# Patient Record
Sex: Male | Born: 1955 | Race: White | Hispanic: No | Marital: Married | Smoking: Former smoker
Health system: Southern US, Community
[De-identification: ages and names within clinical notes are randomized; demographics above are authoritative.]

## PROBLEM LIST (undated history)

## (undated) DIAGNOSIS — J449 Chronic obstructive pulmonary disease, unspecified: Secondary | ICD-10-CM

## (undated) DIAGNOSIS — I10 Essential (primary) hypertension: Secondary | ICD-10-CM

## (undated) DIAGNOSIS — J302 Other seasonal allergic rhinitis: Secondary | ICD-10-CM

## (undated) DIAGNOSIS — M62838 Other muscle spasm: Secondary | ICD-10-CM

## (undated) DIAGNOSIS — K219 Gastro-esophageal reflux disease without esophagitis: Secondary | ICD-10-CM

## (undated) DIAGNOSIS — Z77090 Contact with and (suspected) exposure to asbestos: Secondary | ICD-10-CM

## (undated) DIAGNOSIS — G4730 Sleep apnea, unspecified: Secondary | ICD-10-CM

## (undated) DIAGNOSIS — M1990 Unspecified osteoarthritis, unspecified site: Secondary | ICD-10-CM

## (undated) DIAGNOSIS — E119 Type 2 diabetes mellitus without complications: Secondary | ICD-10-CM

## (undated) DIAGNOSIS — J45909 Unspecified asthma, uncomplicated: Secondary | ICD-10-CM

## (undated) HISTORY — PX: COLONOSCOPY: SHX174

## (undated) HISTORY — PX: CERVICAL FUSION: SHX112

## (undated) HISTORY — PX: NASAL SINUS SURGERY: SHX719

## (undated) HISTORY — PX: HAND SURGERY: SHX662

## (undated) HISTORY — PX: CARPAL TUNNEL RELEASE: SHX101

---

## 2004-09-28 ENCOUNTER — Ambulatory Visit: Payer: Self-pay | Admitting: Otolaryngology

## 2006-08-17 ENCOUNTER — Other Ambulatory Visit: Payer: Self-pay

## 2006-08-24 ENCOUNTER — Ambulatory Visit: Payer: Self-pay | Admitting: Unknown Physician Specialty

## 2008-03-16 ENCOUNTER — Emergency Department: Payer: Self-pay | Admitting: Internal Medicine

## 2008-12-15 ENCOUNTER — Ambulatory Visit: Payer: Self-pay | Admitting: Unknown Physician Specialty

## 2011-01-20 ENCOUNTER — Ambulatory Visit: Payer: Self-pay | Admitting: Unknown Physician Specialty

## 2011-04-13 ENCOUNTER — Ambulatory Visit: Payer: Self-pay | Admitting: Internal Medicine

## 2011-07-07 ENCOUNTER — Encounter (HOSPITAL_COMMUNITY)
Admission: RE | Admit: 2011-07-07 | Discharge: 2011-07-07 | Disposition: A | Discharge status: Home / Self Care | Payer: BC Managed Care – PPO | Source: Ambulatory Visit | Attending: Neurosurgery | Admitting: Neurosurgery

## 2011-07-07 ENCOUNTER — Encounter (HOSPITAL_COMMUNITY): Payer: Self-pay | Admitting: Pharmacy Technician

## 2011-07-07 LAB — BASIC METABOLIC PANEL
BUN: 18 mg/dL (ref 6–23)
CO2: 27 mEq/L (ref 19–32)
Calcium: 9.8 mg/dL (ref 8.4–10.5)
GFR calc non Af Amer: 79 mL/min — ABNORMAL LOW (ref >90)
Glucose, Bld: 96 mg/dL (ref 70–99)
Potassium: 4.1 mEq/L (ref 3.5–5.1)
Sodium: 141 mEq/L (ref 135–145)

## 2011-07-07 LAB — SURGICAL PCR SCREEN: Staphylococcus aureus: POSITIVE — AB

## 2011-07-07 LAB — CBC
HCT: 46.9 % (ref 39.0–52.0)
Hemoglobin: 16.1 g/dL (ref 13.0–17.0)
MCH: 30.4 pg (ref 26.0–34.0)
MCHC: 34.3 g/dL (ref 30.0–36.0)
RBC: 5.3 MIL/uL (ref 4.22–5.81)

## 2011-07-08 ENCOUNTER — Encounter (HOSPITAL_COMMUNITY)
Admission: RE | Disposition: A | Discharge status: Home / Self Care | Payer: Self-pay | Source: Ambulatory Visit | Attending: Neurosurgery

## 2011-07-08 ENCOUNTER — Encounter (HOSPITAL_COMMUNITY): Payer: Self-pay

## 2011-07-08 ENCOUNTER — Inpatient Hospital Stay (HOSPITAL_COMMUNITY)
Admission: RE | Admit: 2011-07-08 | Discharge: 2011-07-09 | DRG: 865 | Disposition: A | Discharge status: Home / Self Care | Payer: BC Managed Care – PPO | Source: Ambulatory Visit | Attending: Neurosurgery | Admitting: Neurosurgery

## 2011-07-08 ENCOUNTER — Other Ambulatory Visit: Payer: Self-pay

## 2011-07-08 ENCOUNTER — Ambulatory Visit (HOSPITAL_COMMUNITY): Payer: BC Managed Care – PPO

## 2011-07-08 ENCOUNTER — Other Ambulatory Visit (HOSPITAL_COMMUNITY): Payer: BC Managed Care – PPO

## 2011-07-08 DIAGNOSIS — J45909 Unspecified asthma, uncomplicated: Secondary | ICD-10-CM | POA: Diagnosis present

## 2011-07-08 DIAGNOSIS — M47812 Spondylosis without myelopathy or radiculopathy, cervical region: Principal | ICD-10-CM | POA: Diagnosis present

## 2011-07-08 DIAGNOSIS — Z01812 Encounter for preprocedural laboratory examination: Secondary | ICD-10-CM

## 2011-07-08 DIAGNOSIS — I10 Essential (primary) hypertension: Secondary | ICD-10-CM | POA: Diagnosis present

## 2011-07-08 SURGERY — ANTERIOR CERVICAL DECOMPRESSION/DISCECTOMY FUSION 3 LEVELS
Anesthesia: General

## 2011-07-08 MED ORDER — ONDANSETRON HCL 4 MG/2ML IJ SOLN: 4.0000 mg | Freq: Four times a day (QID) | INTRAMUSCULAR | Status: DC | PRN

## 2011-07-08 MED ORDER — MORPHINE SULFATE (PF) 1 MG/ML IV SOLN: INTRAVENOUS | Status: DC

## 2011-07-08 MED ORDER — OXYCODONE HCL 5 MG PO TABS: 2.5000 mg | ORAL_TABLET | ORAL | Status: DC | PRN

## 2011-07-08 MED ORDER — DIAZEPAM 5 MG PO TABS: 5.0000 mg | ORAL_TABLET | Freq: Three times a day (TID) | ORAL | Status: DC | PRN

## 2011-07-08 MED ORDER — FLUTICASONE PROPIONATE 50 MCG/ACT NA SUSP
2.0000 | Freq: Every day | NASAL | Status: DC
  Filled 2011-07-08: qty 16

## 2011-07-08 MED ORDER — OXYCODONE-ACETAMINOPHEN 5-325 MG PO TABS: 1.0000 | ORAL_TABLET | ORAL | Status: DC | PRN

## 2011-07-08 MED ORDER — DIPHENHYDRAMINE HCL 50 MG/ML IJ SOLN: 12.5000 mg | Freq: Four times a day (QID) | INTRAMUSCULAR | Status: DC | PRN

## 2011-07-08 MED ORDER — DEXAMETHASONE SODIUM PHOSPHATE 4 MG/ML IJ SOLN
4.0000 mg | Freq: Four times a day (QID) | INTRAMUSCULAR | Status: DC
  Filled 2011-07-08 (×7): qty 1

## 2011-07-08 MED ORDER — MONTELUKAST SODIUM 10 MG PO TABS
10.0000 mg | ORAL_TABLET | Freq: Every day | ORAL | Status: DC
  Filled 2011-07-08 (×3): qty 1

## 2011-07-08 MED ORDER — SODIUM CHLORIDE 0.9 % IJ SOLN: 9.0000 mL | INTRAMUSCULAR | Status: DC | PRN

## 2011-07-08 MED ORDER — DIPHENHYDRAMINE HCL 12.5 MG/5ML PO ELIX: 12.5000 mg | ORAL_SOLUTION | Freq: Four times a day (QID) | ORAL | Status: DC | PRN

## 2011-07-08 MED ORDER — PROCHLORPERAZINE EDISYLATE 5 MG/ML IJ SOLN: 5.0000 mg | Freq: Four times a day (QID) | INTRAMUSCULAR | Status: DC | PRN

## 2011-07-08 MED ORDER — FLUTICASONE PROPIONATE HFA 44 MCG/ACT IN AERO
2.0000 | INHALATION_SPRAY | Freq: Two times a day (BID) | RESPIRATORY_TRACT | Status: DC | PRN
  Filled 2011-07-08: qty 10.6

## 2011-07-08 MED ORDER — ONDANSETRON HCL 4 MG/2ML IJ SOLN: 4.0000 mg | INTRAMUSCULAR | Status: DC | PRN

## 2011-07-08 MED ORDER — AMLODIPINE BESYLATE 5 MG PO TABS
5.0000 mg | ORAL_TABLET | Freq: Every day | ORAL | Status: DC
  Filled 2011-07-08 (×2): qty 1

## 2011-07-08 MED ORDER — ALBUTEROL SULFATE HFA 108 (90 BASE) MCG/ACT IN AERS: 2.0000 | INHALATION_SPRAY | RESPIRATORY_TRACT | Status: DC | PRN

## 2011-07-08 MED ORDER — NALOXONE HCL 0.4 MG/ML IJ SOLN: 0.4000 mg | INTRAMUSCULAR | Status: DC | PRN

## 2012-05-10 ENCOUNTER — Encounter: Payer: Self-pay | Admitting: Neurosurgery

## 2012-06-05 ENCOUNTER — Encounter: Payer: Self-pay | Admitting: Neurosurgery

## 2012-08-09 ENCOUNTER — Ambulatory Visit: Payer: Self-pay | Admitting: Unknown Physician Specialty

## 2012-08-13 LAB — PATHOLOGY REPORT

## 2013-06-10 ENCOUNTER — Other Ambulatory Visit: Payer: Self-pay | Admitting: Neurosurgery

## 2013-06-10 DIAGNOSIS — M545 Low back pain: Secondary | ICD-10-CM

## 2013-06-21 ENCOUNTER — Ambulatory Visit
Admission: RE | Admit: 2013-06-21 | Discharge: 2013-06-21 | Disposition: A | Discharge status: Home / Self Care | Payer: BC Managed Care – PPO | Source: Ambulatory Visit | Attending: Neurosurgery | Admitting: Neurosurgery

## 2013-06-21 DIAGNOSIS — M545 Low back pain: Secondary | ICD-10-CM

## 2013-07-30 NOTE — Progress Notes (Signed)
Surgery scheduled for 08/21/13.  preop on 12/15 at 0800am.  Need orders in EPIC.  Thank You.

## 2013-08-06 ENCOUNTER — Other Ambulatory Visit: Payer: Self-pay | Admitting: Orthopedic Surgery

## 2013-08-06 NOTE — Progress Notes (Signed)
Preoperative surgical orders have been place into the Epic hospital system for Raymond [name] on 08/06/2013, 9:43 AM  by Patrica Duel for surgery on 08-21-2013.  Preop Knee Scope orders including IV Tylenol and IV Decadron as long as there are no contraindications to the above medications. Avel Peace, PA-C

## 2013-08-16 ENCOUNTER — Encounter (HOSPITAL_COMMUNITY): Payer: Self-pay | Admitting: Pharmacy Technician

## 2013-08-16 ENCOUNTER — Other Ambulatory Visit (HOSPITAL_COMMUNITY): Payer: Self-pay | Admitting: Orthopedic Surgery

## 2013-08-16 NOTE — Patient Instructions (Addendum)
20 Raymond [name]  08/16/2013   Your procedure is scheduled on:  08/21/13  [facility]  Report to [facility] at    100 PM     Call this number if you have problems the morning of surgery: [phone]       Remember:   Do not eat food  :After Midnight. Tuesday NIGHT-- MAY HAVE CLEAR LIQUIDS Wednesday MORNING UNTIL 1000am- THEN NOTHING BY MOUTH   Take these medicines the morning of surgery with A SIP OF WATER: AMLODIPINE May take Valium, Norco, or Oxy IR if needed  .  Contacts, dentures or partial plates can not be worn to surgery  Leave suitcase in the car. After surgery it may be brought to your room.  For patients admitted to the hospital, checkout time is 11:00 AM day of  discharge.             SPECIAL INSTRUCTIONS- SEE [facility] PREPARING FOR SURGERY INSTRUCTION SHEET-     DO NOT WEAR JEWELRY, LOTIONS, POWDERS, OR PERFUMES.  WOMEN-- DO NOT SHAVE LEGS OR UNDERARMS FOR 12 HOURS BEFORE SHOWERS. MEN MAY SHAVE FACE.  Patients discharged the day of surgery will not be allowed to drive home. IF going home the day of surgery, you must have a driver and someone to stay with you for the first 24 hours  Name and phone number of your driver:    wife                                                                    Please read over the following fact sheets that you were given: Incentive Spirometry Sheet                                                                                 [name]  PST [phone]  [phone]                 FAILURE TO FOLLOW THESE INSTRUCTIONS MAY RESULT IN  CANCELLATION   OF YOUR SURGERY                                                  Patient Signature _____________________________

## 2013-08-16 NOTE — Progress Notes (Signed)
OV Dr Dan Humphreys, chest, ekg, 11/14, stress test 7/14 on CHART

## 2013-08-19 ENCOUNTER — Encounter (HOSPITAL_COMMUNITY): Payer: Self-pay

## 2013-08-19 ENCOUNTER — Encounter (HOSPITAL_COMMUNITY)
Admission: RE | Admit: 2013-08-19 | Discharge: 2013-08-19 | Disposition: A | Discharge status: Home / Self Care | Payer: BC Managed Care – PPO | Source: Ambulatory Visit | Attending: Orthopedic Surgery | Admitting: Orthopedic Surgery

## 2013-08-19 HISTORY — DX: Other muscle spasm: M62.838

## 2013-08-19 HISTORY — DX: Essential (primary) hypertension: I10

## 2013-08-19 HISTORY — DX: Contact with and (suspected) exposure to asbestos: Z77.090

## 2013-08-19 HISTORY — DX: Other seasonal allergic rhinitis: J30.2

## 2013-08-19 HISTORY — DX: Unspecified asthma, uncomplicated: J45.909

## 2013-08-19 HISTORY — DX: Unspecified osteoarthritis, unspecified site: M19.90

## 2013-08-19 LAB — CBC
HCT: 44.7 % (ref 39.0–52.0)
Hemoglobin: 15.2 g/dL (ref 13.0–17.0)
MCH: 29.4 pg (ref 26.0–34.0)
MCV: 86.5 fL (ref 78.0–100.0)
Platelets: 272 10*3/uL (ref 150–400)
RBC: 5.17 MIL/uL (ref 4.22–5.81)

## 2013-08-19 LAB — BASIC METABOLIC PANEL
CO2: 25 mEq/L (ref 19–32)
Calcium: 9.5 mg/dL (ref 8.4–10.5)
Chloride: 100 mEq/L (ref 96–112)
GFR calc Af Amer: >90 mL/min (ref >90)
Glucose, Bld: 122 mg/dL — ABNORMAL HIGH (ref 70–99)
Potassium: 4.1 mEq/L (ref 3.5–5.1)
Sodium: 137 mEq/L (ref 135–145)

## 2013-08-19 NOTE — Progress Notes (Signed)
DREW-  THIS CASE is posted as left knee scope- pt confirms this-  Order is for right knee arthroscopy--- please correct in epic    thanks

## 2013-08-19 NOTE — Progress Notes (Signed)
At PST visit-  Patient confirmed having LEFT KNEE ARTHROSCOPY-  Case is posted as LEFT-  Orders say RIGHT-   request placed in epic and faxed to Dr Lequita Halt through [facility] to correct

## 2013-08-20 ENCOUNTER — Other Ambulatory Visit: Payer: Self-pay | Admitting: Orthopedic Surgery

## 2013-08-20 DIAGNOSIS — S83249A Other tear of medial meniscus, current injury, unspecified knee, initial encounter: Secondary | ICD-10-CM

## 2013-08-20 NOTE — Progress Notes (Signed)
Spoke with Gala Murdoch at [facility] who will send message up stairs for clarification of pre op order for consent

## 2013-08-20 NOTE — H&P (Signed)
  CC- Raymond [name] is a 57 y.o. male who presents with left knee pain.  HPI- . Knee Pain: Patient presents with knee pain involving the  left knee. Onset of the symptoms was several months ago. Inciting event: injured while jogging. Current symptoms include giving out, locking, pain located medially, stiffness and swelling. Pain is aggravated by lateral movements, pivoting, rising after sitting and squatting.  Patient has had no prior knee problems. Evaluation to date: MRI: abnormal medial meniscal tear. Treatment to date: rest.  Past Medical History  Diagnosis Date  . Hypertension   . Muscle spasms of neck   . Seasonal allergies   . Arthritis   . Asthma   . Asbestos exposure     Past Surgical History  Procedure Laterality Date  . Cervical fusion    . Nasal sinus surgery    . Carpal tunnel release Bilateral   . Hand surgery Right     trigger finger release    Prior to Admission medications   Medication Sig Start Date End Date Taking? Authorizing Provider  amLODipine (NORVASC) 5 MG tablet Take 5 mg by mouth every morning.      Historical Provider, MD  diazepam (VALIUM) 5 MG tablet Take 5 mg by mouth every 8 (eight) hours as needed. For anxiety     Historical Provider, MD  HYDROcodone-acetaminophen (NORCO) 10-325 MG per tablet Take 1 tablet by mouth every 6 (six) hours as needed. For pain     Historical Provider, MD  meloxicam (MOBIC) 15 MG tablet Take 15 mg by mouth at bedtime.     Historical Provider, MD  montelukast (SINGULAIR) 10 MG tablet Take 10 mg by mouth at bedtime.      Historical Provider, MD  oxycodone (OXY-IR) 5 MG capsule Take 5-10 mg by mouth every 4 (four) hours as needed for pain.    Historical Provider, MD  vitamin C (ASCORBIC ACID) 500 MG tablet Take 500 mg by mouth daily.    Historical Provider, MD   KNEE EXAM antalgic gait, soft tissue tenderness over medial joint line, effusion, negative drawer sign, collateral ligaments intact  Physical Examination: General  appearance - alert, well appearing, and in no distress Mental status - alert, oriented to person, place, and time Chest - clear to auscultation, no wheezes, rales or rhonchi, symmetric air entry Heart - normal rate, regular rhythm, normal S1, S2, no murmurs, rubs, clicks or gallops Abdomen - soft, nontender, nondistended, no masses or organomegaly Neurological - alert, oriented, normal speech, no focal findings or movement disorder noted   Asessment/Plan--- Left knee medial meniscal tear- - Plan left knee arthroscopy with meniscal debridement. Procedure risks and potential comps discussed with patient who elects to proceed. Goals are decreased pain and increased function with a high likelihood of achieving both

## 2013-08-20 NOTE — Progress Notes (Signed)
Attempted to page Gareth Eagle at [phone]- states number unavailable.  Left Voice mail with [facility] at [facility] to please contact provider to get operative side verified for surgery in the am and to correct order.

## 2013-08-20 NOTE — Progress Notes (Signed)
Preoperative surgical orders have been place into the Epic hospital system for Raymond [name] on 08/20/2013, 3:42 PM  by Patrica Duel for surgery on 08/21/2013.  Preop Knee Scope orders including IV Tylenol and IV Decadron as long as there are no contraindications to the above medications. Consent was updated. Avel Peace, PA-C

## 2013-08-21 ENCOUNTER — Encounter (HOSPITAL_COMMUNITY): Payer: BC Managed Care – PPO | Admitting: Anesthesiology

## 2013-08-21 ENCOUNTER — Ambulatory Visit (HOSPITAL_COMMUNITY): Payer: BC Managed Care – PPO | Admitting: Anesthesiology

## 2013-08-21 ENCOUNTER — Encounter (HOSPITAL_COMMUNITY)
Admission: RE | Disposition: A | Discharge status: Home / Self Care | Payer: Self-pay | Source: Ambulatory Visit | Attending: Orthopedic Surgery

## 2013-08-21 ENCOUNTER — Ambulatory Visit (HOSPITAL_COMMUNITY)
Admission: RE | Admit: 2013-08-21 | Discharge: 2013-08-21 | Disposition: A | Discharge status: Home / Self Care | Payer: BC Managed Care – PPO | Source: Ambulatory Visit | Attending: Orthopedic Surgery | Admitting: Orthopedic Surgery

## 2013-08-21 ENCOUNTER — Encounter (HOSPITAL_COMMUNITY): Payer: Self-pay | Admitting: *Deleted

## 2013-08-21 DIAGNOSIS — IMO0002 Reserved for concepts with insufficient information to code with codable children: Secondary | ICD-10-CM | POA: Insufficient documentation

## 2013-08-21 DIAGNOSIS — S83249A Other tear of medial meniscus, current injury, unspecified knee, initial encounter: Secondary | ICD-10-CM

## 2013-08-21 DIAGNOSIS — X58XXXA Exposure to other specified factors, initial encounter: Secondary | ICD-10-CM | POA: Insufficient documentation

## 2013-08-21 DIAGNOSIS — I10 Essential (primary) hypertension: Secondary | ICD-10-CM | POA: Insufficient documentation

## 2013-08-21 DIAGNOSIS — S83242A Other tear of medial meniscus, current injury, left knee, initial encounter: Secondary | ICD-10-CM

## 2013-08-21 DIAGNOSIS — J45909 Unspecified asthma, uncomplicated: Secondary | ICD-10-CM | POA: Insufficient documentation

## 2013-08-21 DIAGNOSIS — M2240 Chondromalacia patellae, unspecified knee: Secondary | ICD-10-CM | POA: Insufficient documentation

## 2013-08-21 DIAGNOSIS — Z87891 Personal history of nicotine dependence: Secondary | ICD-10-CM | POA: Insufficient documentation

## 2013-08-21 HISTORY — PX: KNEE ARTHROSCOPY: SHX127

## 2013-08-21 SURGERY — ARTHROSCOPY, KNEE
Anesthesia: General | Site: Knee | Laterality: Left

## 2013-08-21 MED ORDER — METHOCARBAMOL 500 MG PO TABS
ORAL_TABLET | ORAL | Status: AC
  Administered 2013-08-21: 500 mg
  Filled 2013-08-21: qty 1

## 2013-08-21 MED ORDER — CEFAZOLIN SODIUM-DEXTROSE 2-3 GM-% IV SOLR
INTRAVENOUS | Status: AC
  Filled 2013-08-21: qty 50

## 2013-08-21 MED ORDER — FENTANYL CITRATE 0.05 MG/ML IJ SOLN
INTRAMUSCULAR | Status: AC
  Filled 2013-08-21: qty 5

## 2013-08-21 MED ORDER — HYDROMORPHONE HCL PF 1 MG/ML IJ SOLN
INTRAMUSCULAR | Status: AC
  Filled 2013-08-21: qty 1

## 2013-08-21 MED ORDER — SODIUM CHLORIDE 0.9 % IV SOLN: INTRAVENOUS | Status: DC

## 2013-08-21 MED ORDER — KETOROLAC TROMETHAMINE 30 MG/ML IJ SOLN
INTRAMUSCULAR | Status: DC | PRN
  Administered 2013-08-21: 30 mg via INTRAVENOUS

## 2013-08-21 MED ORDER — ONDANSETRON HCL 4 MG/2ML IJ SOLN
INTRAMUSCULAR | Status: DC | PRN
  Administered 2013-08-21: 4 mg via INTRAVENOUS

## 2013-08-21 MED ORDER — BUPIVACAINE-EPINEPHRINE PF 0.25-1:200000 % IJ SOLN
INTRAMUSCULAR | Status: AC
  Filled 2013-08-21: qty 30

## 2013-08-21 MED ORDER — ACETAMINOPHEN 10 MG/ML IV SOLN
INTRAVENOUS | Status: DC | PRN
  Administered 2013-08-21: 1000 mg via INTRAVENOUS

## 2013-08-21 MED ORDER — OXYCODONE HCL 5 MG PO CAPS: 5.0000 mg | ORAL_CAPSULE | ORAL | Status: DC | PRN

## 2013-08-21 MED ORDER — LACTATED RINGERS IV SOLN
INTRAVENOUS | Status: DC | PRN
  Administered 2013-08-21: 16:00:00 via INTRAVENOUS

## 2013-08-21 MED ORDER — LIDOCAINE HCL (CARDIAC) 20 MG/ML IV SOLN
INTRAVENOUS | Status: AC
  Filled 2013-08-21: qty 5

## 2013-08-21 MED ORDER — BUPIVACAINE-EPINEPHRINE 0.25% -1:200000 IJ SOLN
INTRAMUSCULAR | Status: DC | PRN
  Administered 2013-08-21: 20 mL

## 2013-08-21 MED ORDER — KETOROLAC TROMETHAMINE 30 MG/ML IJ SOLN
INTRAMUSCULAR | Status: AC
  Filled 2013-08-21: qty 1

## 2013-08-21 MED ORDER — OXYCODONE HCL 5 MG PO TABS
ORAL_TABLET | ORAL | Status: AC
  Filled 2013-08-21: qty 2

## 2013-08-21 MED ORDER — MIDAZOLAM HCL 5 MG/5ML IJ SOLN
INTRAMUSCULAR | Status: DC | PRN
  Administered 2013-08-21: 2 mg via INTRAVENOUS

## 2013-08-21 MED ORDER — MIDAZOLAM HCL 2 MG/2ML IJ SOLN
INTRAMUSCULAR | Status: AC
  Filled 2013-08-21: qty 2

## 2013-08-21 MED ORDER — DEXAMETHASONE SODIUM PHOSPHATE 10 MG/ML IJ SOLN: 10.0000 mg | Freq: Once | INTRAMUSCULAR | Status: DC

## 2013-08-21 MED ORDER — HYDROMORPHONE HCL PF 1 MG/ML IJ SOLN
0.2500 mg | INTRAMUSCULAR | Status: DC | PRN
  Administered 2013-08-21 (×4): 0.5 mg via INTRAVENOUS

## 2013-08-21 MED ORDER — ACETAMINOPHEN 10 MG/ML IV SOLN
1000.0000 mg | Freq: Once | INTRAVENOUS | Status: DC
  Filled 2013-08-21: qty 100

## 2013-08-21 MED ORDER — ONDANSETRON HCL 4 MG/2ML IJ SOLN
INTRAMUSCULAR | Status: AC
  Filled 2013-08-21: qty 2

## 2013-08-21 MED ORDER — OXYCODONE HCL 5 MG PO TABS
10.0000 mg | ORAL_TABLET | ORAL | Status: AC | PRN
  Administered 2013-08-21: 10 mg via ORAL

## 2013-08-21 MED ORDER — PROPOFOL 10 MG/ML IV BOLUS
INTRAVENOUS | Status: DC | PRN
  Administered 2013-08-21: 200 mg via INTRAVENOUS

## 2013-08-21 MED ORDER — FENTANYL CITRATE 0.05 MG/ML IJ SOLN
INTRAMUSCULAR | Status: DC | PRN
  Administered 2013-08-21 (×2): 50 ug via INTRAVENOUS
  Administered 2013-08-21: 100 ug via INTRAVENOUS
  Administered 2013-08-21: 50 ug via INTRAVENOUS

## 2013-08-21 MED ORDER — PHENYLEPHRINE 40 MCG/ML (10ML) SYRINGE FOR IV PUSH (FOR BLOOD PRESSURE SUPPORT)
PREFILLED_SYRINGE | INTRAVENOUS | Status: AC
  Filled 2013-08-21: qty 10

## 2013-08-21 MED ORDER — CEFAZOLIN SODIUM-DEXTROSE 2-3 GM-% IV SOLR
2.0000 g | INTRAVENOUS | Status: AC
  Administered 2013-08-21: 2 g via INTRAVENOUS

## 2013-08-21 MED ORDER — LIDOCAINE HCL (CARDIAC) 20 MG/ML IV SOLN
INTRAVENOUS | Status: DC | PRN
  Administered 2013-08-21: 100 mg via INTRAVENOUS

## 2013-08-21 MED ORDER — METHOCARBAMOL 500 MG PO TABS: 500.0000 mg | ORAL_TABLET | Freq: Four times a day (QID) | ORAL | Status: DC

## 2013-08-21 MED ORDER — PROPOFOL 10 MG/ML IV BOLUS
INTRAVENOUS | Status: AC
  Filled 2013-08-21: qty 20

## 2013-08-21 MED ORDER — LACTATED RINGERS IR SOLN
Status: DC | PRN
  Administered 2013-08-21: 3000 mL

## 2013-08-21 MED ORDER — CHLORHEXIDINE GLUCONATE 4 % EX LIQD: 60.0000 mL | Freq: Once | CUTANEOUS | Status: DC

## 2013-08-21 MED ORDER — LACTATED RINGERS IV SOLN: INTRAVENOUS | Status: DC

## 2013-08-21 MED ORDER — ATROPINE SULFATE 0.4 MG/ML IJ SOLN
INTRAMUSCULAR | Status: AC
  Filled 2013-08-21: qty 1

## 2013-08-21 MED ORDER — OXYCODONE HCL 5 MG PO TABS: 10.0000 mg | ORAL_TABLET | ORAL | Status: DC | PRN

## 2013-08-21 MED ORDER — PROMETHAZINE HCL 25 MG/ML IJ SOLN: 6.2500 mg | INTRAMUSCULAR | Status: DC | PRN

## 2013-08-21 SURGICAL SUPPLY — 28 items
BANDAGE ELASTIC 6 VELCRO ST LF (GAUZE/BANDAGES/DRESSINGS) ×2 IMPLANT
BANDAGE GAUZE ELAST BULKY 4 IN (GAUZE/BANDAGES/DRESSINGS) ×2 IMPLANT
BLADE 4.2CUDA (BLADE) ×2 IMPLANT
CLOTH BEACON ORANGE TIMEOUT ST (SAFETY) ×2 IMPLANT
COUNTER NEEDLE 20 DBL MAG RED (NEEDLE) ×2 IMPLANT
CUFF TOURN SGL QUICK 34 (TOURNIQUET CUFF) ×1
CUFF TRNQT CYL 34X4X40X1 (TOURNIQUET CUFF) ×1 IMPLANT
DRAPE U-SHAPE 47X51 STRL (DRAPES) ×2 IMPLANT
DRSG EMULSION OIL 3X3 NADH (GAUZE/BANDAGES/DRESSINGS) ×2 IMPLANT
DURAPREP 26ML APPLICATOR (WOUND CARE) ×2 IMPLANT
GLOVE BIO SURGEON STRL SZ8 (GLOVE) ×2 IMPLANT
GLOVE BIOGEL PI IND STRL 8 (GLOVE) ×1 IMPLANT
GLOVE BIOGEL PI INDICATOR 8 (GLOVE) ×1
GOWN PREVENTION PLUS LG XLONG (DISPOSABLE) ×4 IMPLANT
MANIFOLD NEPTUNE II (INSTRUMENTS) ×2 IMPLANT
PACK ARTHROSCOPY WL (CUSTOM PROCEDURE TRAY) ×2 IMPLANT
PACK ICE MAXI GEL EZY WRAP (MISCELLANEOUS) ×6 IMPLANT
PAD ABD 8X10 STRL (GAUZE/BANDAGES/DRESSINGS) ×2 IMPLANT
PADDING CAST COTTON 6X4 STRL (CAST SUPPLIES) ×4 IMPLANT
POSITIONER SURGICAL ARM (MISCELLANEOUS) ×2 IMPLANT
SET ARTHROSCOPY TUBING (MISCELLANEOUS) ×1
SET ARTHROSCOPY TUBING LN (MISCELLANEOUS) ×1 IMPLANT
SPONGE GAUZE 4X4 12PLY (GAUZE/BANDAGES/DRESSINGS) ×2 IMPLANT
SUT ETHILON 4 0 PS 2 18 (SUTURE) ×2 IMPLANT
SYR 20CC LL (SYRINGE) ×2 IMPLANT
TOWEL OR 17X26 10 PK STRL BLUE (TOWEL DISPOSABLE) ×2 IMPLANT
WAND 90 DEG TURBOVAC W/CORD (SURGICAL WAND) ×2 IMPLANT
WRAP KNEE MAXI GEL POST OP (GAUZE/BANDAGES/DRESSINGS) ×2 IMPLANT

## 2013-08-21 NOTE — Anesthesia Preprocedure Evaluation (Signed)
Anesthesia Evaluation  Patient identified by MRN, date of birth, ID band Patient awake    Reviewed: Allergy & Precautions, H&P , NPO status , Patient's Chart, lab work & pertinent test results  Airway Mallampati: II TM Distance: >3 FB Neck ROM: Full    Dental  (+) Caps, Teeth Intact and Dental Advisory Given,    Pulmonary asthma , former smoker,  breath sounds clear to auscultation  Pulmonary exam normal       Cardiovascular hypertension, Pt. on medications Rhythm:Regular Rate:Normal     Neuro/Psych  Neuromuscular disease negative psych ROS   GI/Hepatic negative GI ROS, Neg liver ROS,   Endo/Other  negative endocrine ROS  Renal/GU negative Renal ROS  negative genitourinary   Musculoskeletal negative musculoskeletal ROS (+)   Abdominal   Peds  Hematology negative hematology ROS (+)   Anesthesia Other Findings   Reproductive/Obstetrics                           Anesthesia Physical Anesthesia Plan  ASA: II  Anesthesia Plan: General   Post-op Pain Management:    Induction: Intravenous  Airway Management Planned: LMA  Additional Equipment:   Intra-op Plan:   Post-operative Plan: Extubation in OR  Informed Consent: I have reviewed the patients History and Physical, chart, labs and discussed the procedure including the risks, benefits and alternatives for the proposed anesthesia with the patient or authorized representative who has indicated his/her understanding and acceptance.   Dental advisory given  Plan Discussed with: CRNA  Anesthesia Plan Comments:         Anesthesia Quick Evaluation

## 2013-08-21 NOTE — Preoperative (Signed)
Beta Blockers   Reason not to administer Beta Blockers:Not Applicable 

## 2013-08-21 NOTE — Interval H&P Note (Signed)
History and Physical Interval Note:  08/21/2013 4:34 PM  Raymond [name]  has presented today for surgery, with the diagnosis of LEFT KNEE MEDICAL MENISCUS TEAR  The various methods of treatment have been discussed with the patient and family. After consideration of risks, benefits and other options for treatment, the patient has consented to  Procedure(s): LEFT ARTHROSCOPY KNEE WITH DEBRIDEMENT (Left) as a surgical intervention .  The patient's history has been reviewed, patient examined, no change in status, stable for surgery.  I have reviewed the patient's chart and labs.  Questions were answered to the patient's satisfaction.     Loanne Drilling

## 2013-08-21 NOTE — Op Note (Signed)
Preoperative diagnosis-  Left knee medial meniscal tear  Postoperative diagnosis Left- knee medial meniscal tear plus Left medial femoral chondral defect  Procedure- Left knee arthroscopy with medial meniscal debridement and chondroplasty   Surgeon- Gus Rankin. Cher Egnor, MD  Anesthesia-General  EBL-  minimal Complications- None  Condition- PACU - hemodynamically stable.  Brief clinical note- -Raymond [name] is a 57 y.o.  male with a several year history of left knee pain and mechanical symptoms. Exam and history suggested medial meniscal tear confirmed by MRI. The patient presents now for arthroscopy and debridement   Procedure in detail -       After successful administration of General anesthetic, a tourmiquet is placed high on the Left  thigh and the Left lower extremity is prepped and draped in the usual sterile fashion. Time out is performed by the surgical team. Standard superomedial and inferolateral portal sites are marked and incisions made with an 11 blade. The inflow cannula is passed through the superomedial portal and camera through the inferolateral portal and inflow is initiated. Arthroscopic visualization proceeds.      The undersurface of the patella and trochlea are visualized and there are diffuse grade II and III changes but no areas of unstable cartilage. The medial and lateral gutters are visualized and there are no loose bodies. Flexion and valgus force is applied to the knee and the medial compartment is entered. A spinal needle is passed into the joint through the site marked for the inferomedial portal. A small incision is made and the dilator passed into the joint. The findings for the medial compartment are unstable tear of body and posterior horn of medial meniscus and 2 x 2 cm area of unstable cartilage and Grade III chondromalacia medial femoral condyle . The tear is debrided to a stable base with baskets and a shaver and sealed off with the Arthrocare. The shaver is  used to debride the unstable cartilage to a stable cartilaginous) base with stable edges. It is probed and found to be stable.    The intercondylar notch is visualized and the ACL appears normal. The lateral compartment is entered and the findings are normal .       The joint is again inspected and there are no other tears, defects or loose bodies identified. The arthroscopic equipment is then removed from the inferior portals which are closed with interrupted 4-0 nylon. 20 ml of .25% Marcaine with epinephrine are injected through the inflow cannula and the cannula is then removed and the portal closed with nylon. The incisions are cleaned and dried and a bulky sterile dressing is applied. The patient is then awakened and transported to recovery in stable condition.   08/21/2013, 5:20 PM

## 2013-08-21 NOTE — Transfer of Care (Signed)
Immediate Anesthesia Transfer of Care Note  Patient: Raymond [name]  Procedure(s) Performed: Procedure(s): LEFT ARTHROSCOPY KNEE WITH DEBRIDEMENT AND CHONDROPLASTY (Left)  Patient Location: PACU  Anesthesia Type:General  Level of Consciousness: awake, alert  and oriented  Airway & Oxygen Therapy: Patient Spontanous Breathing and Patient connected to face mask oxygen  Post-op Assessment: Report given to PACU RN and Post -op Vital signs reviewed and stable  Post vital signs: Reviewed and stable  Complications: No apparent anesthesia complications

## 2013-08-21 NOTE — Anesthesia Postprocedure Evaluation (Signed)
Anesthesia Post Note  Patient: Raymond [name]  Procedure(s) Performed: Procedure(s) (LRB): LEFT ARTHROSCOPY KNEE WITH DEBRIDEMENT AND CHONDROPLASTY (Left)  Anesthesia type: General  Patient location: PACU  Post pain: Pain level controlled  Post assessment: Post-op Vital signs reviewed  Last Vitals:  Filed Vitals:   08/21/13 1301  BP: 139/88  Pulse: 84  Temp: 36.6 C  Resp: 18    Post vital signs: Reviewed  Level of consciousness: sedated  Complications: No apparent anesthesia complications

## 2013-08-22 ENCOUNTER — Encounter (HOSPITAL_COMMUNITY): Payer: Self-pay | Admitting: Orthopedic Surgery

## 2015-12-11 ENCOUNTER — Emergency Department
Admission: EM | Admit: 2015-12-11 | Discharge: 2015-12-11 | Disposition: A | Discharge status: Home / Self Care | Payer: BLUE CROSS/BLUE SHIELD | Attending: Emergency Medicine | Admitting: Emergency Medicine

## 2015-12-11 ENCOUNTER — Encounter: Payer: Self-pay | Admitting: Emergency Medicine

## 2015-12-11 DIAGNOSIS — W268XXA Contact with other sharp object(s), not elsewhere classified, initial encounter: Secondary | ICD-10-CM | POA: Insufficient documentation

## 2015-12-11 DIAGNOSIS — S61411A Laceration without foreign body of right hand, initial encounter: Secondary | ICD-10-CM | POA: Insufficient documentation

## 2015-12-11 DIAGNOSIS — Y999 Unspecified external cause status: Secondary | ICD-10-CM | POA: Insufficient documentation

## 2015-12-11 DIAGNOSIS — Z87891 Personal history of nicotine dependence: Secondary | ICD-10-CM | POA: Insufficient documentation

## 2015-12-11 DIAGNOSIS — Z79899 Other long term (current) drug therapy: Secondary | ICD-10-CM | POA: Insufficient documentation

## 2015-12-11 DIAGNOSIS — M1990 Unspecified osteoarthritis, unspecified site: Secondary | ICD-10-CM | POA: Diagnosis not present

## 2015-12-11 DIAGNOSIS — Y9389 Activity, other specified: Secondary | ICD-10-CM | POA: Diagnosis not present

## 2015-12-11 DIAGNOSIS — Y92009 Unspecified place in unspecified non-institutional (private) residence as the place of occurrence of the external cause: Secondary | ICD-10-CM | POA: Insufficient documentation

## 2015-12-11 DIAGNOSIS — Z23 Encounter for immunization: Secondary | ICD-10-CM | POA: Diagnosis not present

## 2015-12-11 DIAGNOSIS — I10 Essential (primary) hypertension: Secondary | ICD-10-CM | POA: Insufficient documentation

## 2015-12-11 DIAGNOSIS — J45909 Unspecified asthma, uncomplicated: Secondary | ICD-10-CM | POA: Diagnosis not present

## 2015-12-11 MED ORDER — TETANUS-DIPHTH-ACELL PERTUSSIS 5-2.5-18.5 LF-MCG/0.5 IM SUSP
0.5000 mL | Freq: Once | INTRAMUSCULAR | Status: AC
  Administered 2015-12-11: 0.5 mL via INTRAMUSCULAR
  Filled 2015-12-11: qty 0.5

## 2015-12-11 MED ORDER — LIDOCAINE-EPINEPHRINE (PF) 1 %-1:200000 IJ SOLN
10.0000 mL | Freq: Once | INTRAMUSCULAR | Status: AC
  Administered 2015-12-11: 10 mL
  Filled 2015-12-11: qty 30

## 2015-12-11 NOTE — ED Notes (Signed)
Cut right palm while working at home. Bleeding controlled with pressure.

## 2015-12-11 NOTE — Discharge Instructions (Signed)
Laceration Care, Adult °A laceration is a cut that goes through all of the layers of the skin and into the tissue that is right under the skin. Some lacerations heal on their own. Others need to be closed with stitches (sutures), staples, skin adhesive strips, or skin glue. Proper laceration care minimizes the risk of infection and helps the laceration to heal better. °HOW TO CARE FOR YOUR LACERATION °If sutures or staples were used: °· Keep the wound clean and dry. °· If you were given a bandage (dressing), you should change it at least one time per day or as told by your health care provider. You should also change it if it becomes wet or dirty. °· Keep the wound completely dry for the first 24 hours or as told by your health care provider. After that time, you may shower or bathe. However, make sure that the wound is not soaked in water until after the sutures or staples have been removed. °· Clean the wound one time each day or as told by your health care provider: °· Wash the wound with soap and water. °· Rinse the wound with water to remove all soap. °· Pat the wound dry with a clean towel. Do not rub the wound. °· After cleaning the wound, apply a thin layer of antibiotic ointment as told by your health care provider. This will help to prevent infection and keep the dressing from sticking to the wound. °· Have the sutures or staples removed as told by your health care provider. °If skin adhesive strips were used: °· Keep the wound clean and dry. °· If you were given a bandage (dressing), you should change it at least one time per day or as told by your health care provider. You should also change it if it becomes dirty or wet. °· Do not get the skin adhesive strips wet. You may shower or bathe, but be careful to keep the wound dry. °· If the wound gets wet, pat it dry with a clean towel. Do not rub the wound. °· Skin adhesive strips fall off on their own. You may trim the strips as the wound heals. Do not  remove skin adhesive strips that are still stuck to the wound. They will fall off in time. °If skin glue was used: °· Try to keep the wound dry, but you may briefly wet it in the shower or bath. Do not soak the wound in water, such as by swimming. °· After you have showered or bathed, gently pat the wound dry with a clean towel. Do not rub the wound. °· Do not do any activities that will make you sweat heavily until the skin glue has fallen off on its own. °· Do not apply liquid, cream, or ointment medicine to the wound while the skin glue is in place. Using those may loosen the film before the wound has healed. °· If you were given a bandage (dressing), you should change it at least one time per day or as told by your health care provider. You should also change it if it becomes dirty or wet. °· If a dressing is placed over the wound, be careful not to apply tape directly over the skin glue. Doing that may cause the glue to be pulled off before the wound has healed. °· Do not pick at the glue. The skin glue usually remains in place for 5-10 days, then it falls off of the skin. °General Instructions °· Take over-the-counter and prescription   medicines only as told by your health care provider. °· If you were prescribed an antibiotic medicine or ointment, take or apply it as told by your doctor. Do not stop using it even if your condition improves. °· To help prevent scarring, make sure to cover your wound with sunscreen whenever you are outside after stitches are removed, after adhesive strips are removed, or when glue remains in place and the wound is healed. Make sure to wear a sunscreen of at least 30 SPF. °· Do not scratch or pick at the wound. °· Keep all follow-up visits as told by your health care provider. This is important. °· Check your wound every day for signs of infection. Watch for: °· Redness, swelling, or pain. °· Fluid, blood, or pus. °· Raise (elevate) the injured area above the level of your heart  while you are sitting or lying down, if possible. °SEEK MEDICAL CARE IF: °· You received a tetanus shot and you have swelling, severe pain, redness, or bleeding at the injection site. °· You have a fever. °· A wound that was closed breaks open. °· You notice a bad smell coming from your wound or your dressing. °· You notice something coming out of the wound, such as wood or glass. °· Your pain is not controlled with medicine. °· You have increased redness, swelling, or pain at the site of your wound. °· You have fluid, blood, or pus coming from your wound. °· You notice a change in the color of your skin near your wound. °· You need to change the dressing frequently due to fluid, blood, or pus draining from the wound. °· You develop a new rash. °· You develop numbness around the wound. °SEEK IMMEDIATE MEDICAL CARE IF: °· You develop severe swelling around the wound. °· Your pain suddenly increases and is severe. °· You develop painful lumps near the wound or on skin that is anywhere on your body. °· You have a red streak going away from your wound. °· The wound is on your hand or foot and you cannot properly move a finger or toe. °· The wound is on your hand or foot and you notice that your fingers or toes look pale or bluish. °  °This information is not intended to replace advice given to you by your health care provider. Make sure you discuss any questions you have with your health care provider. °  °Document Released: 08/22/2005 Document Revised: 01/06/2015 Document Reviewed: 08/18/2014 °Elsevier Interactive Patient Education ©2016 [employer] ° °Stitches, Staples, or Adhesive Wound Closure °Health care providers use stitches (sutures), staples, and certain glue (skin adhesives) to hold skin together while it heals (wound closure). You may need this treatment after you have surgery or if you cut your skin accidentally. These methods help your skin to heal more quickly and make it less likely that you will have  a scar. A wound may take several months to heal completely. °The type of wound you have determines when your wound gets closed. In most cases, the wound is closed as soon as possible (primary skin closure). Sometimes, closure is delayed so the wound can be cleaned and allowed to heal naturally. This reduces the chance of infection. Delayed closure may be needed if your wound: °· Is caused by a bite. °· Happened more than 6 hours ago. °· Involves loss of skin or the tissues under the skin. °· Has dirt or debris in it that cannot be removed. °· Is infected. °WHAT   ARE THE DIFFERENT KINDS OF WOUND CLOSURES? °There are many options for wound closure. The one that your health care provider uses depends on how deep and how large your wound is. °Adhesive Glue °To use this type of glue to close a wound, your health care provider holds the edges of the wound together and paints the glue on the surface of your skin. You may need more than one layer of glue. Then the wound may be covered with a light bandage (dressing). °This type of skin closure may be used for small wounds that are not deep (superficial). Using glue for wound closure is less painful than other methods. It does not require a medicine that numbs the area (local anesthetic). This method also leaves nothing to be removed. Adhesive glue is often used for children and on facial wounds. °Adhesive glue cannot be used for wounds that are deep, uneven, or bleeding. It is not used inside of a wound.  °Adhesive Strips °These strips are made of sticky (adhesive), porous paper. They are applied across your skin edges like a regular adhesive bandage. You leave them on until they fall off. °Adhesive strips may be used to close very superficial wounds. They may also be used along with sutures to improve the closure of your skin edges.  °Sutures °Sutures are the oldest method of wound closure. Sutures can be made from natural substances, such as silk, or from synthetic  materials, such as nylon and steel. They can be made from a material that your body can break down as your wound heals (absorbable), or they can be made from a material that needs to be removed from your skin (nonabsorbable). They come in many different strengths and sizes. °Your health care provider attaches the sutures to a steel needle on one end. Sutures can be passed through your skin, or through the tissues beneath your skin. Then they are tied and cut. Your skin edges may be closed in one continuous stitch or in separate stitches. °Sutures are strong and can be used for all kinds of wounds. Absorbable sutures may be used to close tissues under the skin. The disadvantage of sutures is that they may cause skin reactions that lead to infection. Nonabsorbable sutures need to be removed. °Staples °When surgical staples are used to close a wound, the edges of your skin on both sides of the wound are brought close together. A staple is placed across the wound, and an instrument secures the edges together. Staples are often used to close surgical cuts (incisions). °Staples are faster to use than sutures, and they cause less skin reaction. Staples need to be removed using a tool that bends the staples away from your skin. °HOW DO I CARE FOR MY WOUND CLOSURE? °· Take medicines only as directed by your health care provider. °· If you were prescribed an antibiotic medicine for your wound, finish it all even if you start to feel better. °· Use ointments or creams only as directed by your health care provider. °· Wash your hands with soap and water before and after touching your wound. °· Do not soak your wound in water. Do not take baths, swim, or use a hot tub until your health care provider approves. °· Ask your health care provider when you can start showering. Cover your wound if directed by your health care provider. °· Do not take out your own sutures or staples. °· Do not pick at your wound. Picking can cause an  infection. °·   Keep all follow-up visits as directed by your health care provider. This is important. °HOW LONG WILL I HAVE MY WOUND CLOSURE? °· Leave adhesive glue on your skin until the glue peels away. °· Leave adhesive strips on your skin until the strips fall off. °· Absorbable sutures will dissolve within several days. °· Nonabsorbable sutures and staples must be removed. The location of the wound will determine how long they stay in. This can range from several days to a couple of weeks. °WHEN SHOULD I SEEK HELP FOR MY WOUND CLOSURE? °Contact your health care provider if: °· You have a fever. °· You have chills. °· You have drainage, redness, swelling, or pain at your wound. °· There is a bad smell coming from your wound. °· The skin edges of your wound start to separate after your sutures have been removed. °· Your wound becomes thick, raised, and darker in color after your sutures come out (scarring). °  °This information is not intended to replace advice given to you by your health care provider. Make sure you discuss any questions you have with your health care provider. °  °Document Released: 05/17/2001 Document Revised: 09/12/2014 Document Reviewed: 01/29/2014 °Elsevier Interactive Patient Education ©2016 [employer] ° °

## 2015-12-11 NOTE — ED Provider Notes (Signed)
[facility] Emergency Department Provider Note  ____________________________________________  Time seen: Approximately 7:55 PM  I have reviewed the triage vital signs and the nursing notes.   HISTORY  Chief Complaint Laceration    HPI Raymond [name] is a 60 y.o. male who presents emergency department complaining of a laceration to his right hand. Patient states that he was trying to pick up a broken clay flower pot when he sustained a laceration from one of the sharp edges. Patient reports full range of motion to all digits. Laceration is noted to the palm of his hand. Patient denies significant amounts of pain at this time. He is not taking any medication prior to arrival. Bleeding was controlled prior to arrival with direct pressure. Patient reports irrigating and inspecting the wound prior to arrival and reports no foreign bodies. Patient reports his last tetanus shot was greater than 10 years ago.   Past Medical History  Diagnosis Date  . Hypertension   . Muscle spasms of neck   . Seasonal allergies   . Arthritis   . Asthma   . Asbestos exposure     Patient Active Problem List   Diagnosis Date Noted  . Acute medial meniscal tear 08/20/2013    Past Surgical History  Procedure Laterality Date  . Cervical fusion    . Nasal sinus surgery    . Carpal tunnel release Bilateral   . Hand surgery Right     trigger finger release  . Knee arthroscopy Left 08/21/2013    Procedure: LEFT ARTHROSCOPY KNEE WITH DEBRIDEMENT AND CHONDROPLASTY;  Surgeon: Loanne Drilling, MD;  Location: [facility];  Service: Orthopedics;  Laterality: Left;    Current Outpatient Rx  Name  Route  Sig  Dispense  Refill  . amLODipine (NORVASC) 5 MG tablet   Oral   Take 5 mg by mouth every morning.           . diazepam (VALIUM) 5 MG tablet   Oral   Take 5 mg by mouth every 8 (eight) hours as needed. For anxiety          . HYDROcodone-acetaminophen (NORCO) 10-325 MG per  tablet   Oral   Take 1 tablet by mouth every 6 (six) hours as needed. For pain          . meloxicam (MOBIC) 15 MG tablet   Oral   Take 15 mg by mouth at bedtime.          . methocarbamol (ROBAXIN) 500 MG tablet   Oral   Take 1 tablet (500 mg total) by mouth 4 (four) times daily. As needed for muscle spasm   30 tablet   1   . montelukast (SINGULAIR) 10 MG tablet   Oral   Take 10 mg by mouth at bedtime.           Marland Kitchen oxycodone (OXY-IR) 5 MG capsule   Oral   Take 1-2 capsules (5-10 mg total) by mouth every 4 (four) hours as needed for pain.   60 capsule   0   . vitamin C (ASCORBIC ACID) 500 MG tablet   Oral   Take 500 mg by mouth daily.           Allergies Review of patient's allergies indicates no known allergies.  No family history on file.  Social History Social History  Substance Use Topics  . Smoking status: Former Smoker -- 2.00 packs/day for 15 years    Quit date: 08/19/1989  .  Smokeless tobacco: Never Used  . Alcohol Use: No     Review of Systems  Constitutional: No fever/chills Musculoskeletal: Denies any hand pain. Skin: Negative for rash. Positive for laceration to the right palm. Neurological: Negative for headaches, focal weakness or numbness. 10-point ROS otherwise negative.  ____________________________________________   PHYSICAL EXAM:  VITAL SIGNS: ED Triage Vitals  Enc Vitals Group     BP 12/11/15 1822 141/107 mmHg     Pulse Rate 12/11/15 1822 80     Resp 12/11/15 1822 16     Temp 12/11/15 1822 98.3 F (36.8 C)     Temp src --      SpO2 12/11/15 1822 97 %     Weight 12/11/15 1822 229 lb (103.874 kg)     Height 12/11/15 1822 6\' 2"  (1.88 m)     Head Cir --      Peak Flow --      Pain Score 12/11/15 1812 5     Pain Loc --      Pain Edu? --      Excl. in GC? --      Constitutional: Alert and oriented. Well appearing and in no acute distress. Cardiovascular: Normal rate, regular rhythm. Normal S1 and S2.  Good peripheral  circulation. Respiratory: Normal respiratory effort without tachypnea or retractions. Lungs CTAB. Musculoskeletal: Full range of motion to the right wrist and all digits on right hand. Neurologic:  Normal speech and language. No gross focal neurologic deficits are appreciated.  Skin:  Skin is warm, dry and intact. No rash noted. Laceration is noted to the right palm. This extends from the interdigital space between the thumb and second digit into the palm. This is approximately nontender meters in length. Edges are smooth. No visible foreign body. Bleeding is controlled. Sensation and cap refill intact distally. Psychiatric: Mood and affect are normal. Speech and behavior are normal. Patient exhibits appropriate insight and judgement.   ____________________________________________   LABS (all labs ordered are listed, but only abnormal results are displayed)  Labs Reviewed - No data to display ____________________________________________  EKG   ____________________________________________  RADIOLOGY   No results found.  ____________________________________________    PROCEDURES  Procedure(s) performed:    LACERATION REPAIR Performed by: Racheal PatchesJonathan D Breslyn Abdo Authorized by: Delorise RoyalsJonathan D Ramonda Galyon Consent: Verbal consent obtained. Risks and benefits: risks, benefits and alternatives were discussed Consent given by: patient Patient identity confirmed: provided demographic data Prepped and Draped in normal sterile fashion Wound explored  Laceration Location: Right palm  Laceration Length: 9 cm  No Foreign Bodies seen or palpated  Anesthesia: local infiltration  Local anesthetic: lidocaine 1 % with epinephrine  Anesthetic total: 8 ml  Irrigation method: syringe Amount of cleaning: standard  Skin closure: 4-0 Ethilon sutures   Number of sutures: 8   Technique: Simple interrupted   Patient tolerance: Patient tolerated the procedure well with no immediate  complications.    Medications  Tdap (BOOSTRIX) injection 0.5 mL (0.5 mLs Intramuscular Given 12/11/15 2014)  lidocaine-EPINEPHrine (XYLOCAINE-EPINEPHrine) 1 %-1:200000 (PF) injection 10 mL (10 mLs Infiltration Given by Other 12/11/15 2019)     ____________________________________________   INITIAL IMPRESSION / ASSESSMENT AND PLAN / ED COURSE  Pertinent labs & imaging results that were available during my care of the patient were reviewed by me and considered in my medical decision making (see chart for details).  Patient's diagnosis is consistent with a laceration to the right hand. This is closed as described above.. Patient is given wound care  instruction's. He is to follow-up with primary care in 1 week for suture removal Patient is given ED precautions to return to the ED for any worsening or new symptoms.     ____________________________________________  FINAL CLINICAL IMPRESSION(S) / ED DIAGNOSES  Final diagnoses:  Hand laceration, right, initial encounter      NEW MEDICATIONS STARTED DURING THIS VISIT:  New Prescriptions   No medications on file        This chart was dictated using voice recognition software/Dragon. Despite best efforts to proofread, errors can occur which can change the meaning. Any change was purely unintentional.    Racheal Patches, PA-C 12/11/15 2109  Minna Antis, MD 12/11/15 2337

## 2015-12-11 NOTE — ED Notes (Signed)
Pt alert and oriented X4, active, cooperative, pt in NAD. RR even and unlabored, color WNL.  Pt informed to return if any life threatening symptoms occur.   

## 2015-12-11 NOTE — ED Notes (Signed)
Pt in via triage; pt reports picking up broken clay flower pot, pt with approximately 4 inch laceration to palm of right hand.  Bleeding controlled at this time.

## 2018-05-17 NOTE — H&P (Signed)
TOTAL KNEE ADMISSION H&P  Patient is being admitted for left total knee arthroplasty.  Subjective:  Chief Complaint:left knee pain.  HPI: Azell DerCharles E Bun, 62 y.o. male, has a history of pain and functional disability in the left knee due to arthritis and has failed non-surgical conservative treatments for greater than 12 weeks to includecorticosteriod injections, activity modification and pain management.  Onset of symptoms was gradual, starting >10 years ago with gradually worsening course since that time. The patient noted prior procedures on the knee to include  arthroscopy on the left knee(s).  Patient currently rates pain in the left knee(s) at 6 out of 10 with activity. Patient has worsening of pain with activity and weight bearing, crepitus and joint swelling.  Patient has evidence of medial and patellofemoral bone-on-bone arthritis by imaging studies. There is no active infection.  Patient Active Problem List   Diagnosis Date Noted  . Acute medial meniscal tear 08/20/2013   Past Medical History:  Diagnosis Date  . Arthritis   . Asbestos exposure   . Asthma   . Hypertension   . Muscle spasms of neck   . Seasonal allergies     Past Surgical History:  Procedure Laterality Date  . CARPAL TUNNEL RELEASE Bilateral   . CERVICAL FUSION    . HAND SURGERY Right    trigger finger release  . KNEE ARTHROSCOPY Left 08/21/2013   Procedure: LEFT ARTHROSCOPY KNEE WITH DEBRIDEMENT AND CHONDROPLASTY;  Surgeon: Loanne DrillingFrank V Aluisio, MD;  Location: [facility];  Service: Orthopedics;  Laterality: Left;  . NASAL SINUS SURGERY      No current facility-administered medications for this encounter.    Current Outpatient Medications  Medication Sig Dispense Refill Last Dose  . amLODipine (NORVASC) 5 MG tablet Take 5 mg by mouth every morning.     08/21/2013 at 0800  . diazepam (VALIUM) 5 MG tablet Take 5 mg by mouth every 8 (eight) hours as needed. For anxiety    08/19/13  . HYDROcodone-acetaminophen  (NORCO) 10-325 MG per tablet Take 1 tablet by mouth every 6 (six) hours as needed. For pain    08/21/2013 at 0800  . meloxicam (MOBIC) 15 MG tablet Take 15 mg by mouth at bedtime.    08/20/2013 at Unknown time  . methocarbamol (ROBAXIN) 500 MG tablet Take 1 tablet (500 mg total) by mouth 4 (four) times daily. As needed for muscle spasm 30 tablet 1   . montelukast (SINGULAIR) 10 MG tablet Take 10 mg by mouth at bedtime.     08/20/2013 at Unknown time  . oxycodone (OXY-IR) 5 MG capsule Take 1-2 capsules (5-10 mg total) by mouth every 4 (four) hours as needed for pain. 60 capsule 0   . vitamin C (ASCORBIC ACID) 500 MG tablet Take 500 mg by mouth daily.   08/20/2013 at Unknown time   No Known Allergies  Social History   Tobacco Use  . Smoking status: Former Smoker    Packs/day: 2.00    Years: 15.00    Pack years: 30.00    Last attempt to quit: 08/19/1989    Years since quitting: 28.7  . Smokeless tobacco: Never Used  Substance Use Topics  . Alcohol use: No    No family history on file.   Review of Systems  Constitutional: Negative for chills and fever.  HENT: Negative for congestion, sore throat and tinnitus.   Eyes: Negative for double vision, photophobia and pain.  Respiratory: Negative for cough, shortness of breath and wheezing.  Cardiovascular: Negative for chest pain, palpitations and orthopnea.  Gastrointestinal: Negative for heartburn, nausea and vomiting.  Genitourinary: Negative for dysuria, frequency and urgency.  Musculoskeletal: Positive for joint pain.  Neurological: Negative for dizziness, weakness and headaches.  Psychiatric/Behavioral: Negative for depression.    Objective:  Physical Exam  Well nourished and well developed. General: Alert and oriented x3, cooperative and pleasant, no acute distress. Head: normocephalic, atraumatic, neck supple. Eyes: EOMI. Respiratory: breath sounds clear in all fields, no wheezing, rales, or rhonchi. Cardiovascular: Regular  rate and rhythm, no murmurs, gallops or rubs. Abdomen: non-tender to palpation and soft, normoactive bowel sounds. Musculoskeletal: Nonantalgic gait without using assisted devices. Right Knee Exam: No effusion. Range of motion is 0-125 degrees. Some crepitus on range of motion of the knee. No medial or lateral joint line tenderness. Stable knee. Left Knee Exam: Baker's cyst, tiny effusion. Slight varus deformity. Range of motion is 5-125 degrees. No crepitus on range of motion of the knee. Some medial greater than lateral joint line tenderness. Stable knee. Calves soft and nontender. Motor function intact in LE. Strength 5/5 LE bilaterally. Neuro: Distal pulses 2+. Sensation to light touch intact in LE.  Vital signs in last 24 hours: Blood pressure: 126/86 mmHg   Labs:   Estimated body mass index is 29.4 kg/m as calculated from the following:   Height as of 12/11/15: 6\' 2"  (1.88 m).   Weight as of 12/11/15: 103.9 kg.   Imaging Review Plain radiographs demonstrate severe degenerative joint disease of the left knee(s). The overall alignment isneutral. The bone quality appears to be adequate for age and reported activity level.   Preoperative templating of the joint replacement has been completed, documented, and submitted to the Operating Room personnel in order to optimize intra-operative equipment management.   Anticipated LOS equal to or greater than 2 midnights due to - Age 26 and older with one or more of the following:  - Obesity  - Expected need for hospital services (PT, OT, Nursing) required for safe  discharge  - Anticipated need for postoperative skilled nursing care or inpatient rehab  - Active co-morbidities: Chronic pain requiring opiods OR   - Unanticipated findings during/Post Surgery: None  - Patient is a high risk of re-admission due to: None     Assessment/Plan:  End stage arthritis, left knee   The patient history, physical examination, clinical judgment  of the provider and imaging studies are consistent with end stage degenerative joint disease of the left knee(s) and total knee arthroplasty is deemed medically necessary. The treatment options including medical management, injection therapy arthroscopy and arthroplasty were discussed at length. The risks and benefits of total knee arthroplasty were presented and reviewed. The risks due to aseptic loosening, infection, stiffness, patella tracking problems, thromboembolic complications and other imponderables were discussed. The patient acknowledged the explanation, agreed to proceed with the plan and consent was signed. Patient is being admitted for inpatient treatment for surgery, pain control, PT, OT, prophylactic antibiotics, VTE prophylaxis, progressive ambulation and ADL's and discharge planning. The patient is planning to be discharged home with outpatient physical therapy.   Therapy Plans: outpatient therapy at [facility] in [place] Disposition: Home with wife Planned DVT Prophylaxis: Aspirin 325 mg BID DME needed: walker PCP: Dr. Marcelino Duster (medical clearance provided) TXA: IV Allergies: None Other: Post-operative pain management will be complicated by chronic opioid use.   - Patient was instructed on what medications to stop prior to surgery. - Follow-up visit in 2 weeks with  Dr. Wynelle Link - Begin physical therapy following surgery - Pre-operative lab work as pre-surgical testing - Prescriptions will be provided in hospital at time of discharge  Theresa Duty, PA-C Orthopedic Surgery [facility] [place]

## 2018-06-04 NOTE — Patient Instructions (Signed)
Raymond [name]  06/04/2018   Your procedure is scheduled on: Monday 06/11/2018  Report to [facility] Main  Entrance              Report to admitting at  0600  AM    Call this number if you have problems the morning of surgery [phone]    Remember: Do not eat food or drink liquids :After Midnight.              BRUSH YOUR TEETH MORNING OF SURGERY AND RINSE YOUR MOUTH OUT, NO CHEWING GUM CANDY OR MINTS.     Take these medicines the morning of surgery with A SIP OF WATER: Amlodipine (Norvasc) , Omeprazole (Prilosec), use Albuterol inhaler and Pulmicort inhaler if needed and bring them with you to the hospital.                                You may not have any metal on your body including hair pins and              piercings  Do not wear jewelry, make-up, lotions, powders or perfumes, deodorant                         Men may shave face and neck.   Do not bring valuables to the hospital. [facility] IS NOT             RESPONSIBLE   FOR VALUABLES.  Contacts, dentures or bridgework may not be worn into surgery.  Leave suitcase in the car. After surgery it may be brought to your room.                  Please read over the following fact sheets you were given: _____________________________________________________________________             [facility] - Preparing for Surgery Before surgery, you can play an important role.  Because skin is not sterile, your skin needs to be as free of germs as possible.  You can reduce the number of germs on your skin by washing with CHG (chlorahexidine gluconate) soap before surgery.  CHG is an antiseptic cleaner which kills germs and bonds with the skin to continue killing germs even after washing. Please DO NOT use if you have an allergy to CHG or antibacterial soaps.  If your skin becomes reddened/irritated stop using the CHG and inform your nurse when you arrive at Short Stay. Do not shave (including legs and  underarms) for at least 48 hours prior to the first CHG shower.  You may shave your face/neck. Please follow these instructions carefully:  1.  Shower with CHG Soap the night before surgery and the  morning of Surgery.  2.  If you choose to wash your hair, wash your hair first as usual with your  normal  shampoo.  3.  After you shampoo, rinse your hair and body thoroughly to remove the  shampoo.                           4.  Use CHG as you would any other liquid soap.  You can apply chg directly  to the skin and wash  Gently with a scrungie or clean washcloth.  5.  Apply the CHG Soap to your body ONLY FROM THE NECK DOWN.   Do not use on face/ open                           Wound or open sores. Avoid contact with eyes, ears mouth and genitals (private parts).                       Wash face,  Genitals (private parts) with your normal soap.             6.  Wash thoroughly, paying special attention to the area where your surgery  will be performed.  7.  Thoroughly rinse your body with warm water from the neck down.  8.  DO NOT shower/wash with your normal soap after using and rinsing off  the CHG Soap.                9.  Pat yourself dry with a clean towel.            10.  Wear clean pajamas.            11.  Place clean sheets on your bed the night of your first shower and do not  sleep with pets. Day of Surgery : Do not apply any lotions/deodorants the morning of surgery.  Please wear clean clothes to the hospital/surgery center.  FAILURE TO FOLLOW THESE INSTRUCTIONS MAY RESULT IN THE CANCELLATION OF YOUR SURGERY PATIENT SIGNATURE_________________________________  NURSE SIGNATURE__________________________________  ________________________________________________________________________   Adam Phenix  An incentive spirometer is a tool that can help keep your lungs clear and active. This tool measures how well you are filling your lungs with each breath. Taking  long deep breaths may help reverse or decrease the chance of developing breathing (pulmonary) problems (especially infection) following:  A long period of time when you are unable to move or be active. BEFORE THE PROCEDURE   If the spirometer includes an indicator to show your best effort, your nurse or respiratory therapist will set it to a desired goal.  If possible, sit up straight or lean slightly forward. Try not to slouch.  Hold the incentive spirometer in an upright position. INSTRUCTIONS FOR USE  1. Sit on the edge of your bed if possible, or sit up as far as you can in bed or on a chair. 2. Hold the incentive spirometer in an upright position. 3. Breathe out normally. 4. Place the mouthpiece in your mouth and seal your lips tightly around it. 5. Breathe in slowly and as deeply as possible, raising the piston or the ball toward the top of the column. 6. Hold your breath for 3-5 seconds or for as long as possible. Allow the piston or ball to fall to the bottom of the column. 7. Remove the mouthpiece from your mouth and breathe out normally. 8. Rest for a few seconds and repeat Steps 1 through 7 at least 10 times every 1-2 hours when you are awake. Take your time and take a few normal breaths between deep breaths. 9. The spirometer may include an indicator to show your best effort. Use the indicator as a goal to work toward during each repetition. 10. After each set of 10 deep breaths, practice coughing to be sure your lungs are clear. If you have an incision (the cut made at the time of surgery),  support your incision when coughing by placing a pillow or rolled up towels firmly against it. Once you are able to get out of bed, walk around indoors and cough well. You may stop using the incentive spirometer when instructed by your caregiver.  RISKS AND COMPLICATIONS  Take your time so you do not get dizzy or light-headed.  If you are in pain, you may need to take or ask for pain  medication before doing incentive spirometry. It is harder to take a deep breath if you are having pain. AFTER USE  Rest and breathe slowly and easily.  It can be helpful to keep track of a log of your progress. Your caregiver can provide you with a simple table to help with this. If you are using the spirometer at home, follow these instructions: Ray IF:   You are having difficultly using the spirometer.  You have trouble using the spirometer as often as instructed.  Your pain medication is not giving enough relief while using the spirometer.  You develop fever of 100.5 F (38.1 C) or higher. SEEK IMMEDIATE MEDICAL CARE IF:   You cough up bloody sputum that had not been present before.  You develop fever of 102 F (38.9 C) or greater.  You develop worsening pain at or near the incision site. MAKE SURE YOU:   Understand these instructions.  Will watch your condition.  Will get help right away if you are not doing well or get worse. Document Released: 01/02/2007 Document Revised: 11/14/2011 Document Reviewed: 03/05/2007 Exit[employer] Patient Information 2014 Exit[employer], [place].   ________________________________________________________________________  WHAT IS A BLOOD TRANSFUSION? Blood Transfusion Information  A transfusion is the replacement of blood or some of its parts. Blood is made up of multiple cells which provide different functions.  Red blood cells carry oxygen and are used for blood loss replacement.  White blood cells fight against infection.  Platelets control bleeding.  Plasma helps clot blood.  Other blood products are available for specialized needs, such as hemophilia or other clotting disorders. BEFORE THE TRANSFUSION  Who gives blood for transfusions?   Healthy volunteers who are fully evaluated to make sure their blood is safe. This is blood bank blood. Transfusion therapy is the safest it has ever been in the practice of medicine.  Before blood is taken from a donor, a complete history is taken to make sure that person has no history of diseases nor engages in risky social behavior (examples are intravenous drug use or sexual activity with multiple partners). The donor's travel history is screened to minimize risk of transmitting infections, such as malaria. The donated blood is tested for signs of infectious diseases, such as HIV and hepatitis. The blood is then tested to be sure it is compatible with you in order to minimize the chance of a transfusion reaction. If you or a relative donates blood, this is often done in anticipation of surgery and is not appropriate for emergency situations. It takes many days to process the donated blood. RISKS AND COMPLICATIONS Although transfusion therapy is very safe and saves many lives, the main dangers of transfusion include:   Getting an infectious disease.  Developing a transfusion reaction. This is an allergic reaction to something in the blood you were given. Every precaution is taken to prevent this. The decision to have a blood transfusion has been considered carefully by your caregiver before blood is given. Blood is not given unless the benefits outweigh the risks. AFTER THE TRANSFUSION  Right after receiving a blood transfusion, you will usually feel much better and more energetic. This is especially true if your red blood cells have gotten low (anemic). The transfusion raises the level of the red blood cells which carry oxygen, and this usually causes an energy increase.  The nurse administering the transfusion will monitor you carefully for complications. HOME CARE INSTRUCTIONS  No special instructions are needed after a transfusion. You may find your energy is better. Speak with your caregiver about any limitations on activity for underlying diseases you may have. SEEK MEDICAL CARE IF:   Your condition is not improving after your transfusion.  You develop redness or  irritation at the intravenous (IV) site. SEEK IMMEDIATE MEDICAL CARE IF:  Any of the following symptoms occur over the next 12 hours:  Shaking chills.  You have a temperature by mouth above 102 F (38.9 C), not controlled by medicine.  Chest, back, or muscle pain.  People around you feel you are not acting correctly or are confused.  Shortness of breath or difficulty breathing.  Dizziness and fainting.  You get a rash or develop hives.  You have a decrease in urine output.  Your urine turns a dark color or changes to pink, red, or brown. Any of the following symptoms occur over the next 10 days:  You have a temperature by mouth above 102 F (38.9 C), not controlled by medicine.  Shortness of breath.  Weakness after normal activity.  The white part of the eye turns yellow (jaundice).  You have a decrease in the amount of urine or are urinating less often.  Your urine turns a dark color or changes to pink, red, or brown. Document Released: 08/19/2000 Document Revised: 11/14/2011 Document Reviewed: 04/07/2008 [facility] Patient Information 2014 [place], [place].  _______________________________________________________________________

## 2018-06-06 ENCOUNTER — Encounter (HOSPITAL_COMMUNITY): Payer: Self-pay

## 2018-06-06 ENCOUNTER — Other Ambulatory Visit: Payer: Self-pay

## 2018-06-06 ENCOUNTER — Encounter (HOSPITAL_COMMUNITY)
Admission: RE | Admit: 2018-06-06 | Discharge: 2018-06-06 | Disposition: A | Discharge status: Home / Self Care | Payer: BLUE CROSS/BLUE SHIELD | Source: Ambulatory Visit | Attending: Orthopedic Surgery | Admitting: Orthopedic Surgery

## 2018-06-06 DIAGNOSIS — I10 Essential (primary) hypertension: Secondary | ICD-10-CM | POA: Insufficient documentation

## 2018-06-06 DIAGNOSIS — Z01818 Encounter for other preprocedural examination: Secondary | ICD-10-CM | POA: Diagnosis not present

## 2018-06-06 HISTORY — DX: Gastro-esophageal reflux disease without esophagitis: K21.9

## 2018-06-06 LAB — PROTIME-INR
INR: 0.86
PROTHROMBIN TIME: 11.6 s (ref 11.4–15.2)

## 2018-06-06 LAB — COMPREHENSIVE METABOLIC PANEL
ALK PHOS: 51 U/L (ref 38–126)
ALT: 41 U/L (ref 0–44)
AST: 24 U/L (ref 15–41)
Albumin: 4.3 g/dL (ref 3.5–5.0)
Anion gap: 10 (ref 5–15)
BUN: 22 mg/dL — AB (ref 6–20)
CALCIUM: 9.7 mg/dL (ref 8.9–10.3)
CO2: 30 mmol/L (ref 22–32)
CREATININE: 1.19 mg/dL (ref 0.61–1.24)
Chloride: 103 mmol/L (ref 98–111)
Glucose, Bld: 140 mg/dL — ABNORMAL HIGH (ref 70–99)
Potassium: 4.6 mmol/L (ref 3.5–5.1)
Sodium: 143 mmol/L (ref 135–145)
Total Bilirubin: 0.6 mg/dL (ref 0.3–1.2)
Total Protein: 7.2 g/dL (ref 6.5–8.1)

## 2018-06-06 LAB — CBC
HEMATOCRIT: 44.6 % (ref 39.0–52.0)
HEMOGLOBIN: 14.6 g/dL (ref 13.0–17.0)
MCH: 30 pg (ref 26.0–34.0)
MCHC: 32.7 g/dL (ref 30.0–36.0)
MCV: 91.6 fL (ref 78.0–100.0)
Platelets: 365 10*3/uL (ref 150–400)
RBC: 4.87 MIL/uL (ref 4.22–5.81)
RDW: 13.3 % (ref 11.5–15.5)
WBC: 8 10*3/uL (ref 4.0–10.5)

## 2018-06-06 LAB — SURGICAL PCR SCREEN
MRSA, PCR: NEGATIVE
Staphylococcus aureus: NEGATIVE

## 2018-06-06 LAB — APTT: aPTT: 26 seconds (ref 24–36)

## 2018-06-06 LAB — ABO/RH: ABO/RH(D): A POS

## 2018-06-06 NOTE — Progress Notes (Signed)
   06/06/18 0747  OBSTRUCTIVE SLEEP APNEA  Have you ever been diagnosed with sleep apnea through a sleep study? No  Do you snore loudly (loud enough to be heard through closed doors)?  1  Do you often feel tired, fatigued, or sleepy during the daytime (such as falling asleep during driving or talking to someone)? 0  Has anyone observed you stop breathing during your sleep? 0  Do you have, or are you being treated for high blood pressure? 1  BMI more than 35 kg/m2? 1  Age > 50 (1-yes) 1  Neck circumference greater than:Male 16 inches or larger, Male 17inches or larger? 1  Male Gender (Yes=1) 1  Obstructive Sleep Apnea Score 6  Score 5 or greater  Results sent to PCP

## 2018-06-10 ENCOUNTER — Encounter (HOSPITAL_COMMUNITY): Payer: Self-pay | Admitting: Anesthesiology

## 2018-06-10 MED ORDER — BUPIVACAINE LIPOSOME 1.3 % IJ SUSP
20.0000 mL | Freq: Once | INTRAMUSCULAR | Status: DC
  Filled 2018-06-10: qty 20

## 2018-06-10 MED ORDER — DEXTROSE 5 % IV SOLN
3.0000 g | INTRAVENOUS | Status: AC
  Administered 2018-06-11: 3 g via INTRAVENOUS
  Filled 2018-06-10: qty 3

## 2018-06-10 NOTE — Anesthesia Preprocedure Evaluation (Addendum)
Anesthesia Evaluation  Patient identified by MRN, date of birth, ID band Patient awake    Reviewed: Allergy & Precautions, NPO status , Patient's Chart, lab work & pertinent test results  Airway Mallampati: II  TM Distance: >3 FB Neck ROM: Full    Dental  (+) Teeth Intact, Dental Advisory Given   Pulmonary asthma , former smoker,    breath sounds clear to auscultation       Cardiovascular hypertension, Pt. on medications  Rhythm:Regular Rate:Normal     Neuro/Psych negative psych ROS   GI/Hepatic Neg liver ROS, GERD  Medicated,  Endo/Other  negative endocrine ROS  Renal/GU negative Renal ROS     Musculoskeletal  (+) Arthritis ,   Abdominal Normal abdominal exam  (+)   Peds  Hematology negative hematology ROS (+)   Anesthesia Other Findings   Reproductive/Obstetrics                            Lab Results  Component Value Date   WBC 8.0 06/06/2018   HGB 14.6 06/06/2018   HCT 44.6 06/06/2018   MCV 91.6 06/06/2018   PLT 365 06/06/2018   Lab Results  Component Value Date   INR 0.86 06/06/2018   EKG: NSR  Anesthesia Physical Anesthesia Plan  ASA: II  Anesthesia Plan: Spinal   Post-op Pain Management:  Regional for Post-op pain   Induction: Intravenous  PONV Risk Score and Plan: Propofol infusion, Ondansetron and Dexamethasone  Airway Management Planned: Natural Airway and Simple Face Mask  Additional Equipment:   Intra-op Plan:   Post-operative Plan:   Informed Consent: I have reviewed the patients History and Physical, chart, labs and discussed the procedure including the risks, benefits and alternatives for the proposed anesthesia with the patient or authorized representative who has indicated his/her understanding and acceptance.     Plan Discussed with: CRNA  Anesthesia Plan Comments:        Anesthesia Quick Evaluation

## 2018-06-11 ENCOUNTER — Encounter (HOSPITAL_COMMUNITY): Payer: Self-pay | Admitting: *Deleted

## 2018-06-11 ENCOUNTER — Inpatient Hospital Stay (HOSPITAL_COMMUNITY): Payer: BLUE CROSS/BLUE SHIELD | Admitting: Anesthesiology

## 2018-06-11 ENCOUNTER — Encounter (HOSPITAL_COMMUNITY)
Admission: RE | Disposition: A | Discharge status: Home / Self Care | Payer: Self-pay | Source: Other Acute Inpatient Hospital | Attending: Orthopedic Surgery

## 2018-06-11 ENCOUNTER — Inpatient Hospital Stay (HOSPITAL_COMMUNITY)
Admission: RE | Admit: 2018-06-11 | Discharge: 2018-06-13 | DRG: 470 | Disposition: A | Discharge status: Home / Self Care | Payer: BLUE CROSS/BLUE SHIELD | Source: Other Acute Inpatient Hospital | Attending: Orthopedic Surgery | Admitting: Orthopedic Surgery

## 2018-06-11 ENCOUNTER — Other Ambulatory Visit: Payer: Self-pay

## 2018-06-11 DIAGNOSIS — Z79891 Long term (current) use of opiate analgesic: Secondary | ICD-10-CM | POA: Diagnosis not present

## 2018-06-11 DIAGNOSIS — I10 Essential (primary) hypertension: Secondary | ICD-10-CM | POA: Diagnosis present

## 2018-06-11 DIAGNOSIS — K219 Gastro-esophageal reflux disease without esophagitis: Secondary | ICD-10-CM | POA: Diagnosis present

## 2018-06-11 DIAGNOSIS — G8929 Other chronic pain: Secondary | ICD-10-CM | POA: Diagnosis present

## 2018-06-11 DIAGNOSIS — M25562 Pain in left knee: Secondary | ICD-10-CM | POA: Diagnosis present

## 2018-06-11 DIAGNOSIS — Z87891 Personal history of nicotine dependence: Secondary | ICD-10-CM | POA: Diagnosis not present

## 2018-06-11 DIAGNOSIS — Z7951 Long term (current) use of inhaled steroids: Secondary | ICD-10-CM

## 2018-06-11 DIAGNOSIS — Z77090 Contact with and (suspected) exposure to asbestos: Secondary | ICD-10-CM | POA: Diagnosis present

## 2018-06-11 DIAGNOSIS — Z888 Allergy status to other drugs, medicaments and biological substances status: Secondary | ICD-10-CM | POA: Diagnosis not present

## 2018-06-11 DIAGNOSIS — J302 Other seasonal allergic rhinitis: Secondary | ICD-10-CM | POA: Diagnosis present

## 2018-06-11 DIAGNOSIS — Z6835 Body mass index (BMI) 35.0-35.9, adult: Secondary | ICD-10-CM

## 2018-06-11 DIAGNOSIS — E669 Obesity, unspecified: Secondary | ICD-10-CM | POA: Diagnosis present

## 2018-06-11 DIAGNOSIS — J45909 Unspecified asthma, uncomplicated: Secondary | ICD-10-CM | POA: Diagnosis present

## 2018-06-11 DIAGNOSIS — M62838 Other muscle spasm: Secondary | ICD-10-CM | POA: Diagnosis present

## 2018-06-11 DIAGNOSIS — M1710 Unilateral primary osteoarthritis, unspecified knee: Secondary | ICD-10-CM | POA: Diagnosis present

## 2018-06-11 DIAGNOSIS — M1712 Unilateral primary osteoarthritis, left knee: Principal | ICD-10-CM | POA: Diagnosis present

## 2018-06-11 DIAGNOSIS — Z791 Long term (current) use of non-steroidal anti-inflammatories (NSAID): Secondary | ICD-10-CM

## 2018-06-11 DIAGNOSIS — M179 Osteoarthritis of knee, unspecified: Secondary | ICD-10-CM

## 2018-06-11 HISTORY — PX: TOTAL KNEE ARTHROPLASTY: SHX125

## 2018-06-11 LAB — TYPE AND SCREEN
ABO/RH(D): A POS
Antibody Screen: NEGATIVE

## 2018-06-11 SURGERY — ARTHROPLASTY, KNEE, TOTAL
Anesthesia: Spinal | Site: Knee | Laterality: Left

## 2018-06-11 MED ORDER — HYDROMORPHONE HCL 1 MG/ML IJ SOLN
0.5000 mg | INTRAMUSCULAR | Status: DC | PRN
  Administered 2018-06-11 (×2): 1 mg via INTRAVENOUS
  Filled 2018-06-11 (×2): qty 1

## 2018-06-11 MED ORDER — PROPOFOL 10 MG/ML IV BOLUS
INTRAVENOUS | Status: AC
  Filled 2018-06-11: qty 20

## 2018-06-11 MED ORDER — METHOCARBAMOL 500 MG IVPB - SIMPLE MED
500.0000 mg | Freq: Four times a day (QID) | INTRAVENOUS | Status: DC | PRN
  Filled 2018-06-11: qty 50

## 2018-06-11 MED ORDER — SODIUM CHLORIDE 0.9 % IJ SOLN
INTRAMUSCULAR | Status: AC
  Filled 2018-06-11: qty 50

## 2018-06-11 MED ORDER — PANTOPRAZOLE SODIUM 40 MG PO TBEC
80.0000 mg | DELAYED_RELEASE_TABLET | Freq: Every day | ORAL | Status: DC
  Administered 2018-06-12 – 2018-06-13 (×2): 80 mg via ORAL
  Filled 2018-06-11 (×2): qty 2

## 2018-06-11 MED ORDER — LACTATED RINGERS IV SOLN
INTRAVENOUS | Status: DC
  Administered 2018-06-11: 07:00:00 via INTRAVENOUS

## 2018-06-11 MED ORDER — SODIUM CHLORIDE 0.9 % IJ SOLN
INTRAMUSCULAR | Status: DC | PRN
  Administered 2018-06-11: 60 mL

## 2018-06-11 MED ORDER — LACTATED RINGERS IV SOLN: INTRAVENOUS | Status: DC

## 2018-06-11 MED ORDER — FENTANYL CITRATE (PF) 100 MCG/2ML IJ SOLN
50.0000 ug | INTRAMUSCULAR | Status: DC
  Administered 2018-06-11: 50 ug via INTRAVENOUS

## 2018-06-11 MED ORDER — BISACODYL 10 MG RE SUPP: 10.0000 mg | Freq: Every day | RECTAL | Status: DC | PRN

## 2018-06-11 MED ORDER — FLUTICASONE PROPIONATE 50 MCG/ACT NA SUSP: 1.0000 | Freq: Every day | NASAL | Status: DC | PRN

## 2018-06-11 MED ORDER — ONDANSETRON HCL 4 MG PO TABS: 4.0000 mg | ORAL_TABLET | Freq: Four times a day (QID) | ORAL | Status: DC | PRN

## 2018-06-11 MED ORDER — PROPOFOL 10 MG/ML IV BOLUS
INTRAVENOUS | Status: AC
  Filled 2018-06-11: qty 40

## 2018-06-11 MED ORDER — TRANEXAMIC ACID 1000 MG/10ML IV SOLN
1000.0000 mg | INTRAVENOUS | Status: AC
  Administered 2018-06-11: 1000 mg via INTRAVENOUS
  Filled 2018-06-11: qty 10

## 2018-06-11 MED ORDER — FAMOTIDINE 20 MG PO TABS
20.0000 mg | ORAL_TABLET | Freq: Every day | ORAL | Status: DC
  Administered 2018-06-11 – 2018-06-12 (×2): 20 mg via ORAL
  Filled 2018-06-11 (×2): qty 1

## 2018-06-11 MED ORDER — MONTELUKAST SODIUM 10 MG PO TABS
10.0000 mg | ORAL_TABLET | Freq: Every day | ORAL | Status: DC
  Administered 2018-06-11 – 2018-06-12 (×2): 10 mg via ORAL
  Filled 2018-06-11 (×2): qty 1

## 2018-06-11 MED ORDER — TRANEXAMIC ACID 1000 MG/10ML IV SOLN
1000.0000 mg | Freq: Once | INTRAVENOUS | Status: AC
  Administered 2018-06-11: 1000 mg via INTRAVENOUS
  Filled 2018-06-11: qty 1000

## 2018-06-11 MED ORDER — ONDANSETRON HCL 4 MG/2ML IJ SOLN
INTRAMUSCULAR | Status: AC
  Filled 2018-06-11: qty 2

## 2018-06-11 MED ORDER — POLYETHYLENE GLYCOL 3350 17 G PO PACK
17.0000 g | PACK | Freq: Every day | ORAL | Status: DC | PRN
  Administered 2018-06-12: 17 g via ORAL
  Filled 2018-06-11 (×2): qty 1

## 2018-06-11 MED ORDER — SODIUM CHLORIDE 0.9 % IJ SOLN
INTRAMUSCULAR | Status: AC
  Filled 2018-06-11: qty 10

## 2018-06-11 MED ORDER — PROMETHAZINE HCL 25 MG/ML IJ SOLN: 6.2500 mg | INTRAMUSCULAR | Status: DC | PRN

## 2018-06-11 MED ORDER — METOCLOPRAMIDE HCL 5 MG/ML IJ SOLN: 5.0000 mg | Freq: Three times a day (TID) | INTRAMUSCULAR | Status: DC | PRN

## 2018-06-11 MED ORDER — MIDAZOLAM HCL 2 MG/2ML IJ SOLN
INTRAMUSCULAR | Status: AC
  Administered 2018-06-11: 2 mg via INTRAVENOUS
  Filled 2018-06-11: qty 2

## 2018-06-11 MED ORDER — GABAPENTIN 300 MG PO CAPS
300.0000 mg | ORAL_CAPSULE | Freq: Three times a day (TID) | ORAL | Status: DC
  Administered 2018-06-11 – 2018-06-13 (×6): 300 mg via ORAL
  Filled 2018-06-11 (×6): qty 1

## 2018-06-11 MED ORDER — OXYCODONE HCL 5 MG PO TABS
5.0000 mg | ORAL_TABLET | ORAL | Status: DC | PRN
  Administered 2018-06-12: 10 mg via ORAL
  Filled 2018-06-11: qty 2

## 2018-06-11 MED ORDER — MIDAZOLAM HCL 2 MG/2ML IJ SOLN
1.0000 mg | INTRAMUSCULAR | Status: DC
  Administered 2018-06-11: 2 mg via INTRAVENOUS

## 2018-06-11 MED ORDER — ALBUTEROL SULFATE (2.5 MG/3ML) 0.083% IN NEBU: 2.5000 mg | INHALATION_SOLUTION | RESPIRATORY_TRACT | Status: DC | PRN

## 2018-06-11 MED ORDER — SODIUM CHLORIDE 0.9 % IR SOLN
Status: DC | PRN
  Administered 2018-06-11: 1000 mL

## 2018-06-11 MED ORDER — PROPOFOL 500 MG/50ML IV EMUL
INTRAVENOUS | Status: DC | PRN
  Administered 2018-06-11: 40 ug/kg/min via INTRAVENOUS

## 2018-06-11 MED ORDER — EPHEDRINE 5 MG/ML INJ
INTRAVENOUS | Status: AC
  Filled 2018-06-11: qty 10

## 2018-06-11 MED ORDER — ZOLPIDEM TARTRATE 10 MG PO TABS: 10.0000 mg | ORAL_TABLET | Freq: Every evening | ORAL | Status: DC | PRN

## 2018-06-11 MED ORDER — HYDROMORPHONE HCL 1 MG/ML IJ SOLN
0.5000 mg | INTRAMUSCULAR | Status: DC | PRN
  Administered 2018-06-11: 1 mg via INTRAVENOUS
  Filled 2018-06-11: qty 1

## 2018-06-11 MED ORDER — DEXAMETHASONE SODIUM PHOSPHATE 10 MG/ML IJ SOLN
8.0000 mg | Freq: Once | INTRAMUSCULAR | Status: AC
  Administered 2018-06-11: 8 mg via INTRAVENOUS

## 2018-06-11 MED ORDER — SODIUM CHLORIDE 0.9 % IV SOLN
INTRAVENOUS | Status: DC
  Administered 2018-06-11: 10:00:00 via INTRAVENOUS

## 2018-06-11 MED ORDER — PHENYLEPHRINE 40 MCG/ML (10ML) SYRINGE FOR IV PUSH (FOR BLOOD PRESSURE SUPPORT)
PREFILLED_SYRINGE | INTRAVENOUS | Status: DC | PRN
  Administered 2018-06-11: 80 ug via INTRAVENOUS
  Administered 2018-06-11: 40 ug via INTRAVENOUS
  Administered 2018-06-11: 80 ug via INTRAVENOUS

## 2018-06-11 MED ORDER — METHOCARBAMOL 500 MG IVPB - SIMPLE MED
INTRAVENOUS | Status: AC
  Administered 2018-06-11: 500 mg
  Filled 2018-06-11: qty 50

## 2018-06-11 MED ORDER — DEXAMETHASONE SODIUM PHOSPHATE 10 MG/ML IJ SOLN
10.0000 mg | Freq: Once | INTRAMUSCULAR | Status: AC
  Administered 2018-06-12: 10 mg via INTRAVENOUS
  Filled 2018-06-11: qty 1

## 2018-06-11 MED ORDER — ACETAMINOPHEN 10 MG/ML IV SOLN
1000.0000 mg | Freq: Four times a day (QID) | INTRAVENOUS | Status: DC
  Administered 2018-06-11: 1000 mg via INTRAVENOUS
  Filled 2018-06-11: qty 100

## 2018-06-11 MED ORDER — OXYCODONE HCL 5 MG PO TABS
10.0000 mg | ORAL_TABLET | ORAL | Status: DC | PRN
  Administered 2018-06-11 – 2018-06-13 (×10): 15 mg via ORAL
  Filled 2018-06-11 (×10): qty 3

## 2018-06-11 MED ORDER — DIPHENHYDRAMINE HCL 12.5 MG/5ML PO ELIX: 12.5000 mg | ORAL_SOLUTION | ORAL | Status: DC | PRN

## 2018-06-11 MED ORDER — AMLODIPINE BESYLATE 10 MG PO TABS
10.0000 mg | ORAL_TABLET | Freq: Every day | ORAL | Status: DC
  Administered 2018-06-12 – 2018-06-13 (×2): 10 mg via ORAL
  Filled 2018-06-11 (×2): qty 1

## 2018-06-11 MED ORDER — ASPIRIN EC 325 MG PO TBEC
325.0000 mg | DELAYED_RELEASE_TABLET | Freq: Two times a day (BID) | ORAL | Status: DC
  Administered 2018-06-12 – 2018-06-13 (×3): 325 mg via ORAL
  Filled 2018-06-11 (×3): qty 1

## 2018-06-11 MED ORDER — CHLORHEXIDINE GLUCONATE 4 % EX LIQD: 60.0000 mL | Freq: Once | CUTANEOUS | Status: DC

## 2018-06-11 MED ORDER — DIAZEPAM 5 MG PO TABS
5.0000 mg | ORAL_TABLET | Freq: Three times a day (TID) | ORAL | Status: DC | PRN
  Administered 2018-06-11 – 2018-06-12 (×2): 5 mg via ORAL
  Filled 2018-06-11 (×2): qty 1

## 2018-06-11 MED ORDER — BUDESONIDE 0.5 MG/2ML IN SUSP: 1.0000 mg | Freq: Two times a day (BID) | RESPIRATORY_TRACT | Status: DC | PRN

## 2018-06-11 MED ORDER — BUPIVACAINE IN DEXTROSE 0.75-8.25 % IT SOLN
INTRATHECAL | Status: DC | PRN
  Administered 2018-06-11: 2 mL via INTRATHECAL

## 2018-06-11 MED ORDER — PHENYLEPHRINE 40 MCG/ML (10ML) SYRINGE FOR IV PUSH (FOR BLOOD PRESSURE SUPPORT)
PREFILLED_SYRINGE | INTRAVENOUS | Status: AC
  Filled 2018-06-11: qty 10

## 2018-06-11 MED ORDER — METHOCARBAMOL 500 MG PO TABS
500.0000 mg | ORAL_TABLET | Freq: Four times a day (QID) | ORAL | Status: DC | PRN
  Administered 2018-06-11 – 2018-06-13 (×6): 500 mg via ORAL
  Filled 2018-06-11 (×5): qty 1

## 2018-06-11 MED ORDER — DEXAMETHASONE SODIUM PHOSPHATE 10 MG/ML IJ SOLN
INTRAMUSCULAR | Status: AC
  Filled 2018-06-11: qty 1

## 2018-06-11 MED ORDER — DOCUSATE SODIUM 100 MG PO CAPS
100.0000 mg | ORAL_CAPSULE | Freq: Two times a day (BID) | ORAL | Status: DC
  Administered 2018-06-11 – 2018-06-13 (×4): 100 mg via ORAL
  Filled 2018-06-11 (×4): qty 1

## 2018-06-11 MED ORDER — MEPERIDINE HCL 50 MG/ML IJ SOLN: 6.2500 mg | INTRAMUSCULAR | Status: DC | PRN

## 2018-06-11 MED ORDER — PHENOL 1.4 % MT LIQD: 1.0000 | OROMUCOSAL | Status: DC | PRN

## 2018-06-11 MED ORDER — HYDROMORPHONE HCL 1 MG/ML IJ SOLN: 0.2500 mg | INTRAMUSCULAR | Status: DC | PRN

## 2018-06-11 MED ORDER — ONDANSETRON HCL 4 MG/2ML IJ SOLN: 4.0000 mg | Freq: Four times a day (QID) | INTRAMUSCULAR | Status: DC | PRN

## 2018-06-11 MED ORDER — ROPIVACAINE HCL 7.5 MG/ML IJ SOLN
INTRAMUSCULAR | Status: DC | PRN
  Administered 2018-06-11: 20 mL via PERINEURAL

## 2018-06-11 MED ORDER — FLEET ENEMA 7-19 GM/118ML RE ENEM: 1.0000 | ENEMA | Freq: Once | RECTAL | Status: DC | PRN

## 2018-06-11 MED ORDER — ATORVASTATIN CALCIUM 10 MG PO TABS
10.0000 mg | ORAL_TABLET | Freq: Every day | ORAL | Status: DC
  Administered 2018-06-11 – 2018-06-12 (×2): 10 mg via ORAL
  Filled 2018-06-11 (×3): qty 1

## 2018-06-11 MED ORDER — MENTHOL 3 MG MT LOZG: 1.0000 | LOZENGE | OROMUCOSAL | Status: DC | PRN

## 2018-06-11 MED ORDER — ACETAMINOPHEN 500 MG PO TABS
1000.0000 mg | ORAL_TABLET | Freq: Four times a day (QID) | ORAL | Status: AC
  Administered 2018-06-11 – 2018-06-12 (×4): 1000 mg via ORAL
  Filled 2018-06-11 (×4): qty 2

## 2018-06-11 MED ORDER — GABAPENTIN 300 MG PO CAPS
300.0000 mg | ORAL_CAPSULE | Freq: Once | ORAL | Status: AC
  Administered 2018-06-11: 300 mg via ORAL
  Filled 2018-06-11: qty 1

## 2018-06-11 MED ORDER — CEFAZOLIN SODIUM-DEXTROSE 2-4 GM/100ML-% IV SOLN
2.0000 g | Freq: Four times a day (QID) | INTRAVENOUS | Status: AC
  Administered 2018-06-11 (×2): 2 g via INTRAVENOUS
  Filled 2018-06-11 (×2): qty 100

## 2018-06-11 MED ORDER — BUPIVACAINE LIPOSOME 1.3 % IJ SUSP
INTRAMUSCULAR | Status: DC | PRN
  Administered 2018-06-11: 20 mL

## 2018-06-11 MED ORDER — ONDANSETRON HCL 4 MG/2ML IJ SOLN
INTRAMUSCULAR | Status: DC | PRN
  Administered 2018-06-11: 4 mg via INTRAVENOUS

## 2018-06-11 MED ORDER — FENTANYL CITRATE (PF) 100 MCG/2ML IJ SOLN
INTRAMUSCULAR | Status: AC
  Administered 2018-06-11: 50 ug via INTRAVENOUS
  Filled 2018-06-11: qty 2

## 2018-06-11 MED ORDER — METOCLOPRAMIDE HCL 5 MG PO TABS: 5.0000 mg | ORAL_TABLET | Freq: Three times a day (TID) | ORAL | Status: DC | PRN

## 2018-06-11 SURGICAL SUPPLY — 57 items
ATTUNE MED DOME PAT 41 KNEE (Knees) ×2 IMPLANT
ATTUNE MED DOME PAT 41MM KNEE (Knees) ×1 IMPLANT
ATTUNE PS FEM LT SZ 8 CEM KNEE (Femur) ×3 IMPLANT
ATTUNE PSRP INSR SZ8 7 KNEE (Insert) ×2 IMPLANT
ATTUNE PSRP INSR SZ8 7MM KNEE (Insert) ×1 IMPLANT
BAG ZIPLOCK 12X15 (MISCELLANEOUS) ×3 IMPLANT
BANDAGE ACE 6X5 VEL STRL LF (GAUZE/BANDAGES/DRESSINGS) ×3 IMPLANT
BASE TIBIAL ROT PLAT SZ 8 KNEE (Knees) ×1 IMPLANT
BLADE SAG 18X100X1.27 (BLADE) ×3 IMPLANT
BLADE SAW SGTL 11.0X1.19X90.0M (BLADE) ×3 IMPLANT
BOWL SMART MIX CTS (DISPOSABLE) ×3 IMPLANT
CEMENT HV SMART SET (Cement) ×6 IMPLANT
CLOSURE WOUND 1/2 X4 (GAUZE/BANDAGES/DRESSINGS) ×1
COVER SURGICAL LIGHT HANDLE (MISCELLANEOUS) ×3 IMPLANT
CUFF TOURN SGL QUICK 34 (TOURNIQUET CUFF) ×2
CUFF TRNQT CYL 34X4X40X1 (TOURNIQUET CUFF) ×1 IMPLANT
DECANTER SPIKE VIAL GLASS SM (MISCELLANEOUS) ×3 IMPLANT
DRAPE U-SHAPE 47X51 STRL (DRAPES) ×3 IMPLANT
DRSG ADAPTIC 3X8 NADH LF (GAUZE/BANDAGES/DRESSINGS) ×3 IMPLANT
DRSG PAD ABDOMINAL 8X10 ST (GAUZE/BANDAGES/DRESSINGS) ×3 IMPLANT
DURAPREP 26ML APPLICATOR (WOUND CARE) ×3 IMPLANT
ELECT REM PT RETURN 15FT ADLT (MISCELLANEOUS) ×3 IMPLANT
EVACUATOR 1/8 PVC DRAIN (DRAIN) ×3 IMPLANT
GAUZE SPONGE 4X4 12PLY STRL (GAUZE/BANDAGES/DRESSINGS) ×3 IMPLANT
GLOVE BIO SURGEON STRL SZ7 (GLOVE) IMPLANT
GLOVE BIO SURGEON STRL SZ8 (GLOVE) ×3 IMPLANT
GLOVE BIOGEL PI IND STRL 6.5 (GLOVE) ×5 IMPLANT
GLOVE BIOGEL PI IND STRL 7.0 (GLOVE) IMPLANT
GLOVE BIOGEL PI IND STRL 8 (GLOVE) ×1 IMPLANT
GLOVE BIOGEL PI INDICATOR 6.5 (GLOVE) ×10
GLOVE BIOGEL PI INDICATOR 7.0 (GLOVE)
GLOVE BIOGEL PI INDICATOR 8 (GLOVE) ×2
GLOVE SURG SS PI 6.5 STRL IVOR (GLOVE) ×3 IMPLANT
GOWN STRL REUS W/TWL LRG LVL3 (GOWN DISPOSABLE) ×9 IMPLANT
GOWN STRL REUS W/TWL XL LVL3 (GOWN DISPOSABLE) ×6 IMPLANT
HANDPIECE INTERPULSE COAX TIP (DISPOSABLE) ×2
HOLDER FOLEY CATH W/STRAP (MISCELLANEOUS) IMPLANT
IMMOBILIZER KNEE 20 (SOFTGOODS) ×3
IMMOBILIZER KNEE 20 THIGH 36 (SOFTGOODS) ×1 IMPLANT
MANIFOLD NEPTUNE II (INSTRUMENTS) ×3 IMPLANT
NS IRRIG 1000ML POUR BTL (IV SOLUTION) ×3 IMPLANT
PACK TOTAL KNEE CUSTOM (KITS) ×3 IMPLANT
PADDING CAST COTTON 6X4 STRL (CAST SUPPLIES) ×3 IMPLANT
PIN STEINMAN FIXATION KNEE (PIN) ×3 IMPLANT
PIN THREADED HEADED SIGMA (PIN) ×3 IMPLANT
POSITIONER SURGICAL ARM (MISCELLANEOUS) ×3 IMPLANT
SET HNDPC FAN SPRY TIP SCT (DISPOSABLE) ×1 IMPLANT
STRIP CLOSURE SKIN 1/2X4 (GAUZE/BANDAGES/DRESSINGS) ×2 IMPLANT
SUT MNCRL AB 4-0 PS2 18 (SUTURE) ×3 IMPLANT
SUT STRATAFIX 0 PDS 27 VIOLET (SUTURE) ×3
SUT VIC AB 2-0 CT1 27 (SUTURE) ×6
SUT VIC AB 2-0 CT1 TAPERPNT 27 (SUTURE) ×3 IMPLANT
SUTURE STRATFX 0 PDS 27 VIOLET (SUTURE) ×1 IMPLANT
TIBIAL BASE ROT PLAT SZ 8 KNEE (Knees) ×3 IMPLANT
TRAY FOLEY MTR SLVR 16FR STAT (SET/KITS/TRAYS/PACK) ×3 IMPLANT
WRAP KNEE MAXI GEL POST OP (GAUZE/BANDAGES/DRESSINGS) ×3 IMPLANT
YANKAUER SUCT BULB TIP 10FT TU (MISCELLANEOUS) ×3 IMPLANT

## 2018-06-11 NOTE — Evaluation (Signed)
Physical Therapy Evaluation Patient Details Name: Raymond [name] MRN: [id number] DOB: March 14, 1956 Today's Date: 06/11/2018   History of Present Illness  62 YO male s/p L TKR on 06/11/18. PMH includes OA, HTN, carpal tunnel release bilat, cervical fusion, R trigger finger release surgery, L knee scope 2014.   Clinical Impression   Pt presents with moderate to high L knee pain during evaluation, difficulty performing bed mobility, increased time and effort for transfers/ambulation, and decreased tolerance for ambulation secondary to pain. Pt tot benefit from acute PT to address deficits. Pt ambulated 70 ft with RW with min guard assist. After ambulation, pt complaining of increased aching pain in knee, at 7/10. RN notified of this. PT to progress mobility as tolerated, and will continue to follow acutely.     Follow Up Recommendations Follow surgeon's recommendation for DC plan and follow-up therapies;Supervision for mobility/OOB(OPPT )    Equipment Recommendations  Rolling walker with 5" wheels(Pt's wife already filled prescription for RW, but got a rollator instead. Pt and wife aware that they will have to purchase 2 wheel RW )    Recommendations for Other Services       Precautions / Restrictions Precautions Precautions: Fall Required Braces or Orthoses: Knee Immobilizer - Left Knee Immobilizer - Left: On when out of bed or walking;Discontinue once straight leg raise with < 10 degree lag Restrictions Weight Bearing Restrictions: No Other Position/Activity Restrictions: WBAT       Mobility  Bed Mobility Overal bed mobility: Needs Assistance Bed Mobility: Supine to Sit     Supine to sit: Min assist;HOB elevated     General bed mobility comments: Assist for trunk elevation and scooting to EOB. Pt encouraged to sit EOB for a couple of minutes prior to mobilizing to ensure no dizziness/lightheadedness/nausea, pt wanting to immediately stand.   Transfers Overall transfer level:  Needs assistance Equipment used: Rolling walker (2 wheeled) Transfers: Sit to/from Stand Sit to Stand: +2 safety/equipment;Min guard         General transfer comment: Min guard for safety. Pt stood before PT could give verbal cuing for hand placement. Pt encouraged to push with one UE from bed next time when standing from bed.   Ambulation/Gait Ambulation/Gait assistance: Min guard Gait Distance (Feet): 70 Feet Assistive device: Rolling walker (2 wheeled) Gait Pattern/deviations: Step-to pattern;Decreased stride length;Decreased stance time - left;Decreased weight shift to left;Antalgic;Trunk flexed Gait velocity: decr    General Gait Details: Verbal cuing for sequencing, placement in RW, turning. Min guard for safety.   Stairs            Wheelchair Mobility    Modified Rankin (Stroke Patients Only)       Balance Overall balance assessment: Mild deficits observed, not formally tested                                           Pertinent Vitals/Pain Pain Assessment: 0-10 Pain Score: 4  Pain Location: L knee  Pain Descriptors / Indicators: Constant;Aching Pain Intervention(s): Limited activity within patient's tolerance;Repositioned;Monitored during session;Ice applied    Home Living Family/patient expects to be discharged to:: Private residence Living Arrangements: Spouse/significant other Available Help at Discharge: Family;Available PRN/intermittently Type of Home: House Home Access: Stairs to enter Entrance Stairs-Rails: Doctor, general practice of Steps: 3 (2, landing on [place], then step into house)  Home Layout: One level Home Equipment: Shower seat -  built in;Hand held shower head;Bedside commode;Walker - 4 wheels;Cane - single point      Prior Function Level of Independence: Independent               Hand Dominance   Dominant Hand: Right    Extremity/Trunk Assessment   Upper Extremity Assessment Upper Extremity  Assessment: Overall WFL for tasks assessed    Lower Extremity Assessment Lower Extremity Assessment: Overall WFL for tasks assessed;LLE deficits/detail LLE Deficits / Details: suspected post-surgical weakness; able to perform quad set x2, ankle pumps, SLR with >10* quad lag with donning knee immobilizer     Cervical / Trunk Assessment Cervical / Trunk Assessment: Normal  Communication   Communication: No difficulties  Cognition Arousal/Alertness: Awake/alert Behavior During Therapy: WFL for tasks assessed/performed Overall Cognitive Status: Within Functional Limits for tasks assessed                                        General Comments      Exercises Total Joint Exercises Ankle Circles/Pumps: AROM;Both;10 reps;Seated   Assessment/Plan    PT Assessment Patient needs continued PT services  PT Problem List Decreased strength;Pain;Decreased range of motion;Decreased activity tolerance;Decreased balance;Decreased safety awareness;Decreased knowledge of use of DME;Decreased mobility       PT Treatment Interventions DME instruction;Therapeutic activities;Gait training;Therapeutic exercise;Patient/family education;Stair training;Balance training;Functional mobility training    PT Goals (Current goals can be found in the Care Plan section)  Acute Rehab PT Goals PT Goal Formulation: With patient Time For Goal Achievement: 06/25/18 Potential to Achieve Goals: Good    Frequency 7X/week   Barriers to discharge        Co-evaluation               AM-PAC PT "6 Clicks" Daily Activity  Outcome Measure Difficulty turning over in bed (including adjusting bedclothes, sheets and blankets)?: Unable Difficulty moving from lying on back to sitting on the side of the bed? : Unable Difficulty sitting down on and standing up from a chair with arms (e.g., wheelchair, bedside commode, etc,.)?: Unable Help needed moving to and from a bed to chair (including a  wheelchair)?: None Help needed walking in hospital room?: A Little Help needed climbing 3-5 steps with a railing? : A Little 6 Click Score: 13    End of Session Equipment Utilized During Treatment: Gait belt Activity Tolerance: Patient limited by pain;Patient tolerated treatment well Patient left: in chair;with chair alarm set;with call bell/phone within reach;with family/visitor present;with SCD's reapplied Nurse Communication: Mobility status;Patient requests pain meds PT Visit Diagnosis: Other abnormalities of gait and mobility (R26.89);Difficulty in walking, not elsewhere classified (R26.2)    Time: 6433-2951 PT Time Calculation (min) (ACUTE ONLY): 26 min   Charges:   PT Evaluation $PT Eval Low Complexity: 1 Low PT Treatments $Gait Training: 8-22 mins        Nicola Police, PT Acute Rehabilitation Services Pager [phone]  Office [phone]  Mayrani Khamis D Despina Hidden 06/11/2018, 3:38 PM

## 2018-06-11 NOTE — Op Note (Signed)
OPERATIVE REPORT-TOTAL KNEE ARTHROPLASTY   Pre-operative diagnosis- Osteoarthritis  Left knee(s)  Post-operative diagnosis- Osteoarthritis Left knee(s)  Procedure-  Left  Total Knee Arthroplasty (Depuy Attune)  Surgeon- Gus Rankin. Griffen Frayne, MD  Assistant- Dimitri Ped, PA-C   Anesthesia-  Adductor canal block and spinal  EBL-50 mL   Drains Hemovac  Tourniquet time-  Total Tourniquet Time Documented: Thigh (Left) - 41 minutes Total: Thigh (Left) - 41 minutes     Complications- None  Condition-PACU - hemodynamically stable.   Brief Clinical Note   Raymond [name] is a 62 y.o. year old male with end stage OA of his left knee with progressively worsening pain and dysfunction. He has constant pain, with activity and at rest and significant functional deficits with difficulties even with ADLs. He has had extensive non-op management including analgesics, injections of cortisone and viscosupplements, and home exercise program, but remains in significant pain with significant dysfunction. Radiographs show bone on bone arthritis medial and patellofemoral. He presents now for left Total Knee Arthroplasty.    Procedure in detail---   The patient is brought into the operating room and positioned supine on the operating table. After successful administration of  Adductor canal block and spinal,   a tourniquet is placed high on the  Left thigh(s) and the lower extremity is prepped and draped in the usual sterile fashion. Time out is performed by the operating team and then the  Left lower extremity is wrapped in Esmarch, knee flexed and the tourniquet inflated to 300 mmHg.       A midline incision is made with a ten blade through the subcutaneous tissue to the level of the extensor mechanism. A fresh blade is used to make a medial parapatellar arthrotomy. Soft tissue over the proximal medial tibia is subperiosteally elevated to the joint line with a knife and into the semimembranosus bursa  with a Cobb elevator. Soft tissue over the proximal lateral tibia is elevated with attention being paid to avoiding the patellar tendon on the tibial tubercle. The patella is everted, knee flexed 90 degrees and the ACL and PCL are removed. Findings are bone on bone medial and patellofemoral with large global osteophytes        The drill is used to create a starting hole in the distal femur and the canal is thoroughly irrigated with sterile saline to remove the fatty contents. The 5 degree Left  valgus alignment guide is placed into the femoral canal and the distal femoral cutting block is pinned to remove 9 mm off the distal femur. Resection is made with an oscillating saw.      The tibia is subluxed forward and the menisci are removed. The extramedullary alignment guide is placed referencing proximally at the medial aspect of the tibial tubercle and distally along the second metatarsal axis and tibial crest. The block is pinned to remove 2mm off the more deficient medial  side. Resection is made with an oscillating saw. Size 8is the most appropriate size for the tibia and the proximal tibia is prepared with the modular drill and keel punch for that size.      The femoral sizing guide is placed and size 8 is most appropriate. Rotation is marked off the epicondylar axis and confirmed by creating a rectangular flexion gap at 90 degrees. The size 8 cutting block is pinned in this rotation and the anterior, posterior and chamfer cuts are made with the oscillating saw. The intercondylar block is then placed and that cut  is made.      Trial size 8 tibial component, trial size 8 posterior stabilized femur and a 7  mm posterior stabilized rotating platform insert trial is placed. Full extension is achieved with excellent varus/valgus and anterior/posterior balance throughout full range of motion. The patella is everted and thickness measured to be 27  mm. Free hand resection is taken to 15 mm, a 41 template is placed,  lug holes are drilled, trial patella is placed, and it tracks normally. Osteophytes are removed off the posterior femur with the trial in place. All trials are removed and the cut bone surfaces prepared with pulsatile lavage. Cement is mixed and once ready for implantation, the size 8 tibial implant, size  8 posterior stabilized femoral component, and the size 41 patella are cemented in place and the patella is held with the clamp. The trial insert is placed and the knee held in full extension. The Exparel (20 ml mixed with 60 ml saline) is injected into the extensor mechanism, posterior capsule, medial and lateral gutters and subcutaneous tissues.  All extruded cement is removed and once the cement is hard the permanent 7 mm posterior stabilized rotating platform insert is placed into the tibial tray.      The wound is copiously irrigated with saline solution and the extensor mechanism closed over a hemovac drain with #1 V-loc suture. The tourniquet is released for a total tourniquet time of 41  minutes. Flexion against gravity is 140 degrees and the patella tracks normally. Subcutaneous tissue is closed with 2.0 vicryl and subcuticular with running 4.0 Monocryl. The incision is cleaned and dried and steri-strips and a bulky sterile dressing are applied. The limb is placed into a knee immobilizer and the patient is awakened and transported to recovery in stable condition.      Please note that a surgical assistant was a medical necessity for this procedure in order to perform it in a safe and expeditious manner. Surgical assistant was necessary to retract the ligaments and vital neurovascular structures to prevent injury to them and also necessary for proper positioning of the limb to allow for anatomic placement of the prosthesis.   Gus Rankin Yulonda Wheeling, MD    06/11/2018, 9:26 AM

## 2018-06-11 NOTE — Progress Notes (Signed)
Assisted Dr. Hollis with left, ultrasound guided, adductor canal block. Side rails up, monitors on throughout procedure. See vital signs in flow sheet. Tolerated Procedure well.  

## 2018-06-11 NOTE — Anesthesia Postprocedure Evaluation (Signed)
Anesthesia Post Note  Patient: Raymond [name]  Procedure(s) Performed: LEFT TOTAL KNEE ARTHROPLASTY (Left Knee)     Patient location during evaluation: PACU Anesthesia Type: Spinal Level of consciousness: oriented and awake and alert Pain management: pain level controlled Vital Signs Assessment: post-procedure vital signs reviewed and stable Respiratory status: spontaneous breathing, respiratory function stable and patient connected to nasal cannula oxygen Cardiovascular status: blood pressure returned to baseline and stable Postop Assessment: no headache, no backache, no apparent nausea or vomiting, spinal receding and patient able to bend at knees Anesthetic complications: no    Last Vitals:  Vitals:   06/11/18 1237 06/11/18 1337  BP: 120/85 (!) 141/93  Pulse: 90 98  Resp: 14 14  Temp: 36.7 C   SpO2: 96% 97%    Last Pain:  Vitals:   06/11/18 1332  TempSrc:   PainSc: 7                  Shelton Silvas

## 2018-06-11 NOTE — Anesthesia Procedure Notes (Signed)
Spinal  Start time: 06/11/2018 8:10 AM End time: 06/11/2018 8:16 AM Staffing Resident/CRNA: Victoriano Lain, CRNA Preanesthetic Checklist Completed: patient identified, site marked, surgical consent, pre-op evaluation, timeout performed, IV checked, risks and benefits discussed and monitors and equipment checked Spinal Block Patient position: sitting Prep: site prepped and draped and DuraPrep Patient monitoring: heart rate, continuous pulse ox and blood pressure Approach: midline Location: L3-4 Injection technique: single-shot Needle Needle type: Pencan  Needle gauge: 24 G Needle length: 10 cm Assessment Sensory level: T4 Additional Notes Pt placed in sitting position for spinal placement. Spinal kit expiration date checked and verified. +CSF, - heme. Pt tolerated well.

## 2018-06-11 NOTE — Transfer of Care (Signed)
Immediate Anesthesia Transfer of Care Note  Patient: Raymond [name]  Procedure(s) Performed: LEFT TOTAL KNEE ARTHROPLASTY (Left Knee)  Patient Location: PACU  Anesthesia Type:Spinal  Level of Consciousness: awake, alert , oriented and patient cooperative  Airway & Oxygen Therapy: Patient Spontanous Breathing and Patient connected to face mask oxygen  Post-op Assessment: Report given to RN and Post -op Vital signs reviewed and stable  Post vital signs: Reviewed and stable  Last Vitals:  Vitals Value Taken Time  BP 103/69 06/11/2018  9:40 AM  Temp    Pulse 97 06/11/2018  9:41 AM  Resp 16 06/11/2018  9:41 AM  SpO2 100 % 06/11/2018  9:41 AM  Vitals shown include unvalidated device data.  Last Pain:  Vitals:   06/11/18 0803  TempSrc:   PainSc: 0-No pain      Patients Stated Pain Goal: 4 (06/11/18 0631)  Complications: No apparent anesthesia complications

## 2018-06-11 NOTE — Interval H&P Note (Signed)
History and Physical Interval Note:  06/11/2018 6:39 AM  Raymond [name]  has presented today for surgery, with the diagnosis of left knee osteoarthritis  The various methods of treatment have been discussed with the patient and family. After consideration of risks, benefits and other options for treatment, the patient has consented to  Procedure(s): LEFT TOTAL KNEE ARTHROPLASTY (Left) as a surgical intervention .  The patient's history has been reviewed, patient examined, no change in status, stable for surgery.  I have reviewed the patient's chart and labs.  Questions were answered to the patient's satisfaction.     Homero Fellers Latricia Cerrito

## 2018-06-11 NOTE — Anesthesia Procedure Notes (Signed)
Anesthesia Regional Block: Adductor canal block   Pre-Anesthetic Checklist: ,, timeout performed, Correct Patient, Correct Site, Correct Laterality, Correct Procedure, Correct Position, site marked, Risks and benefits discussed,  Surgical consent,  Pre-op evaluation,  At surgeon's request and post-op pain management  Laterality: Left  Prep: chloraprep       Needles:  Injection technique: Single-shot  Needle Type: Echogenic Stimulator Needle     Needle Length: 9cm  Needle Gauge: 21     Additional Needles:   Procedures:,,,, ultrasound used (permanent image in chart),,,,  Narrative:  Start time: 06/11/2018 7:35 AM End time: 06/11/2018 7:40 AM Injection made incrementally with aspirations every 5 mL.  Performed by: Personally  Anesthesiologist: Shelton Silvas, MD  Additional Notes: Patient tolerated the procedure well. Local anesthetic introduced in an incremental fashion under minimal resistance after negative aspirations. No paresthesias were elicited. After completion of the procedure, no acute issues were identified and patient continued to be monitored by RN.

## 2018-06-11 NOTE — Discharge Instructions (Signed)
° °Dr. Frank Aluisio °Total Joint Specialist °[facility] °[address] °[place], [place] [address] °[phone] ° °TOTAL KNEE REPLACEMENT POSTOPERATIVE DIRECTIONS ° °Knee Rehabilitation, Guidelines Following Surgery  °Results after knee surgery are often greatly improved when you follow the exercise, range of motion and muscle strengthening exercises prescribed by your doctor. Safety measures are also important to protect the knee from further injury. Any time any of these exercises cause you to have increased pain or swelling in your knee joint, decrease the amount until you are comfortable again and slowly increase them. If you have problems or questions, call your caregiver or physical therapist for advice.  ° °HOME CARE INSTRUCTIONS  °• Remove items at home which could result in a fall. This includes throw rugs or furniture in walking pathways.  °· ICE to the affected knee every three hours for 30 minutes at a time and then as needed for pain and swelling.  Continue to use ice on the knee for pain and swelling from surgery. You may notice swelling that will progress down to the foot and ankle.  This is normal after surgery.  Elevate the leg when you are not up walking on it.   °· Continue to use the breathing machine which will help keep your temperature down.  It is common for your temperature to cycle up and down following surgery, especially at night when you are not up moving around and exerting yourself.  The breathing machine keeps your lungs expanded and your temperature down. °· Do not place pillow under knee, focus on keeping the knee straight while resting ° °DIET °You may resume your previous home diet once your are discharged from the hospital. ° °DRESSING / WOUND CARE / SHOWERING °You may shower 3 days after surgery, but keep the wounds dry during showering.  You may use an occlusive plastic wrap (Press'n Seal for example), NO SOAKING/SUBMERGING IN THE BATHTUB.  If the bandage  gets wet, change with a clean dry gauze.  If the incision gets wet, pat the wound dry with a clean towel. °You may start showering once you are discharged home but do not submerge the incision under water. Just pat the incision dry and apply a dry gauze dressing on daily. °Change the surgical dressing daily and reapply a dry dressing each time. ° °ACTIVITY °Walk with your walker as instructed. °Use walker as long as suggested by your caregivers. °Avoid periods of inactivity such as sitting longer than an hour when not asleep. This helps prevent blood clots.  °You may resume a sexual relationship in one month or when given the OK by your doctor.  °You may return to work once you are cleared by your doctor.  °Do not drive a car for 6 weeks or until released by you surgeon.  °Do not drive while taking narcotics. ° °WEIGHT BEARING °Weight bearing as tolerated with assist device (walker, cane, etc) as directed, use it as long as suggested by your surgeon or therapist, typically at least 4-6 weeks. ° °POSTOPERATIVE CONSTIPATION PROTOCOL °Constipation - defined medically as fewer than three stools per week and severe constipation as less than one stool per week. ° °One of the most common issues patients have following surgery is constipation.  Even if you have a regular bowel pattern at home, your normal regimen is likely to be disrupted due to multiple reasons following surgery.  Combination of anesthesia, postoperative narcotics, change in appetite and fluid intake all can affect your bowels.    In order to avoid complications following surgery, here are some recommendations in order to help you during your recovery period. ° °Colace (docusate) - Pick up an over-the-counter form of Colace or another stool softener and take twice a day as long as you are requiring postoperative pain medications.  Take with a full glass of water daily.  If you experience loose stools or diarrhea, hold the colace until you stool forms back  up.  If your symptoms do not get better within 1 week or if they get worse, check with your doctor. ° °Dulcolax (bisacodyl) - Pick up over-the-counter and take as directed by the product packaging as needed to assist with the movement of your bowels.  Take with a full glass of water.  Use this product as needed if not relieved by Colace only.  ° °MiraLax (polyethylene glycol) - Pick up over-the-counter to have on hand.  MiraLax is a solution that will increase the amount of water in your bowels to assist with bowel movements.  Take as directed and can mix with a glass of water, juice, soda, coffee, or tea.  Take if you go more than two days without a movement. °Do not use MiraLax more than once per day. Call your doctor if you are still constipated or irregular after using this medication for 7 days in a row. ° °If you continue to have problems with postoperative constipation, please contact the office for further assistance and recommendations.  If you experience "the worst abdominal pain ever" or develop nausea or vomiting, please contact the office immediatly for further recommendations for treatment. ° °ITCHING ° If you experience itching with your medications, try taking only a single pain pill, or even half a pain pill at a time.  You can also use Benadryl over the counter for itching or also to help with sleep.  ° °TED HOSE STOCKINGS °Wear the elastic stockings on both legs for three weeks following surgery during the day but you may remove then at night for sleeping. ° °MEDICATIONS °See your medication summary on the “After Visit Summary” that the nursing staff will review with you prior to discharge.  You may have some home medications which will be placed on hold until you complete the course of blood thinner medication.  It is important for you to complete the blood thinner medication as prescribed by your surgeon.  Continue your approved medications as instructed at time of discharge. ° °PRECAUTIONS °If  you experience chest pain or shortness of breath - call 911 immediately for transfer to the hospital emergency department.  °If you develop a fever greater that 101 F, purulent drainage from wound, increased redness or drainage from wound, foul odor from the wound/dressing, or calf pain - CONTACT YOUR SURGEON.   °                                                °FOLLOW-UP APPOINTMENTS °Make sure you keep all of your appointments after your operation with your surgeon and caregivers. You should call the office at the above phone number and make an appointment for approximately two weeks after the date of your surgery or on the date instructed by your surgeon outlined in the "After Visit Summary". ° ° °RANGE OF MOTION AND STRENGTHENING EXERCISES  °Rehabilitation of the knee is important following a knee injury or   an operation. After just a few days of immobilization, the muscles of the thigh which control the knee become weakened and shrink (atrophy). Knee exercises are designed to build up the tone and strength of the thigh muscles and to improve knee motion. Often times heat used for twenty to thirty minutes before working out will loosen up your tissues and help with improving the range of motion but do not use heat for the first two weeks following surgery. These exercises can be done on a training (exercise) mat, on the floor, on a table or on a bed. Use what ever works the best and is most comfortable for you Knee exercises include:  °• Leg Lifts - While your knee is still immobilized in a splint or cast, you can do straight leg raises. Lift the leg to 60 degrees, hold for 3 sec, and slowly lower the leg. Repeat 10-20 times 2-3 times daily. Perform this exercise against resistance later as your knee gets better.  °• Quad and Hamstring Sets - Tighten up the muscle on the front of the thigh (Quad) and hold for 5-10 sec. Repeat this 10-20 times hourly. Hamstring sets are done by pushing the foot backward against an  object and holding for 5-10 sec. Repeat as with quad sets.  °· Leg Slides: Lying on your back, slowly slide your foot toward your buttocks, bending your knee up off the floor (only go as far as is comfortable). Then slowly slide your foot back down until your leg is flat on the floor again. °· Angel Wings: Lying on your back spread your legs to the side as far apart as you can without causing discomfort.  °A rehabilitation program following serious knee injuries can speed recovery and prevent re-injury in the future due to weakened muscles. Contact your doctor or a physical therapist for more information on knee rehabilitation.  ° °IF YOU ARE TRANSFERRED TO A SKILLED REHAB FACILITY °If the patient is transferred to a skilled rehab facility following release from the hospital, a list of the current medications will be sent to the facility for the patient to continue.  When discharged from the skilled rehab facility, please have the facility set up the patient's Home Health Physical Therapy prior to being released. Also, the skilled facility will be responsible for providing the patient with their medications at time of release from the facility to include their pain medication, the muscle relaxants, and their blood thinner medication. If the patient is still at the rehab facility at time of the two week follow up appointment, the skilled rehab facility will also need to assist the patient in arranging follow up appointment in our office and any transportation needs. ° °MAKE SURE YOU:  °• Understand these instructions.  °• Get help right away if you are not doing well or get worse.  ° ° °Pick up stool softner and laxative for home use following surgery while on pain medications. °Do not submerge incision under water. °Please use good hand washing techniques while changing dressing each day. °May shower starting three days after surgery. °Please use a clean towel to pat the incision dry following showers. °Continue to  use ice for pain and swelling after surgery. °Do not use any lotions or creams on the incision until instructed by your surgeon. ° °

## 2018-06-12 ENCOUNTER — Encounter (HOSPITAL_COMMUNITY): Payer: Self-pay | Admitting: Orthopedic Surgery

## 2018-06-12 LAB — BASIC METABOLIC PANEL
ANION GAP: 7 (ref 5–15)
BUN: 18 mg/dL (ref 6–20)
CALCIUM: 8.6 mg/dL — AB (ref 8.9–10.3)
CO2: 28 mmol/L (ref 22–32)
Chloride: 107 mmol/L (ref 98–111)
Creatinine, Ser: 1.02 mg/dL (ref 0.61–1.24)
GLUCOSE: 175 mg/dL — AB (ref 70–99)
POTASSIUM: 4.5 mmol/L (ref 3.5–5.1)
Sodium: 142 mmol/L (ref 135–145)

## 2018-06-12 LAB — CBC
HEMATOCRIT: 38.7 % — AB (ref 39.0–52.0)
Hemoglobin: 12.5 g/dL — ABNORMAL LOW (ref 13.0–17.0)
MCH: 29.9 pg (ref 26.0–34.0)
MCHC: 32.3 g/dL (ref 30.0–36.0)
MCV: 92.6 fL (ref 80.0–100.0)
PLATELETS: 310 10*3/uL (ref 150–400)
RBC: 4.18 MIL/uL — AB (ref 4.22–5.81)
RDW: 13.6 % (ref 11.5–15.5)
WBC: 20.1 10*3/uL — ABNORMAL HIGH (ref 4.0–10.5)

## 2018-06-12 NOTE — Care Management Note (Signed)
Case Management Note  Patient Details  Name: Raymond [name] MRN: [id number] Date of Birth: 09-05-1956  Subjective/Objective:  Spoke with patient at bedside. Confirmed plan for OP PT, already arranged. Has RW and 3n1. [phone]                  Action/Plan:   Expected Discharge Date:  06/12/18               Expected Discharge Plan:  OP Rehab  In-House Referral:  NA  Discharge planning Services  CM Consult  Post Acute Care Choice:  NA Choice offered to:  Patient, Spouse  DME Arranged:  N/A DME Agency:  NA  HH Arranged:  NA HH Agency:  NA  Status of Service:  Completed, signed off  If discussed at Long Length of Stay Meetings, dates discussed:    Additional Comments:  Alexis Goodell, RN 06/12/2018, 10:31 AM

## 2018-06-12 NOTE — Progress Notes (Signed)
Physical Therapy Treatment Patient Details Name: Raymond [name] MRN: [id number] DOB: [birth year] Today's Date: 06/12/2018    History of Present Illness 62 YO male s/p L TKR on 06/11/18. PMH includes OA, HTN, carpal tunnel release bilat, cervical fusion, R trigger finger release surgery, L knee scope 2014.     PT Comments    The patient is progressing well. Plans Dc tomorrow after PT/steps.   Follow Up Recommendations  Follow surgeon's recommendation for DC plan and follow-up therapies;Supervision for mobility/OOB;Outpatient PT     Equipment Recommendations  Rolling walker with 5" wheels    Recommendations for Other Services       Precautions / Restrictions Precautions Precautions: Fall;Knee Precaution Comments: did not wear this visit Required Braces or Orthoses: Knee Immobilizer - Left Knee Immobilizer - Left: On when out of bed or walking;Discontinue once straight leg raise with < 10 degree lag Restrictions Other Position/Activity Restrictions: WBAT     Mobility  Bed Mobility Overal bed mobility: Needs Assistance Bed Mobility: Sit to Supine     Supine to sit: Min guard Sit to supine: Min guard   General bed mobility comments: patient  used belt  for self assisting the left leg  Transfers Overall transfer level: Needs assistance Equipment used: Rolling walker (2 wheeled) Transfers: Sit to/from Stand Sit to Stand: Min guard         General transfer comment: Min guard for safety , hand and left leg position  Ambulation/Gait Ambulation/Gait assistance: Min guard Gait Distance (Feet): 150 Feet Assistive device: Rolling walker (2 wheeled) Gait Pattern/deviations: Step-to pattern;Step-through pattern     General Gait Details: Verbal cuing for sequencing, placement in RW, turning. Min guard for safety. did not wear Left KI   Stairs             Wheelchair Mobility    Modified Rankin (Stroke Patients Only)       Balance                                             Cognition Arousal/Alertness: Awake/alert                                            Exercises Total Joint Exercises Ankle Circles/Pumps: AROM;Both;10 reps;Seated;Supine Quad Sets: AROM;Left;10 reps;Right;Supine Heel Slides: AAROM;Left;10 reps;Supine Hip ABduction/ADduction: AAROM;Left;10 reps;Supine Straight Leg Raises: AAROM;Left;10 reps;Supine Long Arc Quad: AAROM;Left;10 reps Knee Flexion: AAROM;Left;10 reps Goniometric ROM: 10-60 left knee flexion.    General Comments        Pertinent Vitals/Pain Pain Score: 4  Pain Location: L knee , thigh Pain Descriptors / Indicators: Tender;Constant Pain Intervention(s): Monitored during session;Premedicated before session;Ice applied;Repositioned    Home Living                      Prior Function            PT Goals (current goals can now be found in the care plan section) Progress towards PT goals: Progressing toward goals    Frequency    7X/week      PT Plan Current plan remains appropriate    Co-evaluation              AM-PAC PT "6 Clicks" Daily Activity  Outcome Measure  Difficulty turning over in bed (including adjusting bedclothes, sheets and blankets)?: A Little Difficulty moving from lying on back to sitting on the side of the bed? : A Little Difficulty sitting down on and standing up from a chair with arms (e.g., wheelchair, bedside commode, etc,.)?: A Little Help needed moving to and from a bed to chair (including a wheelchair)?: A Little Help needed walking in hospital room?: A Little Help needed climbing 3-5 steps with a railing? : A Lot 6 Click Score: 17    End of Session Equipment Utilized During Treatment: Gait belt;Left knee immobilizer Activity Tolerance: Patient tolerated treatment well Patient left: in bed;with call bell/phone within reach;with bed alarm set;with family/visitor present Nurse Communication: Mobility  status PT Visit Diagnosis: Other abnormalities of gait and mobility (R26.89);Difficulty in walking, not elsewhere classified (R26.2)     Time: 1610-9604 PT Time Calculation (min) (ACUTE ONLY): 31 min  Charges:  $Gait Training: 8-22 mins $Therapeutic Exercise: 8-22 mins   Blanchard Kelch PT Acute Rehabilitation Services Pager [phone] Office [phone]    Rada Hay 06/12/2018, 4:24 PM

## 2018-06-12 NOTE — Progress Notes (Signed)
Physical Therapy Treatment Patient Details Name: Raymond [name] MRN: [id number] DOB: [birth year] Today's Date: 06/12/2018    History of Present Illness 62 YO male s/p L TKR on 06/11/18. PMH includes OA, HTN, carpal tunnel release bilat, cervical fusion, R trigger finger release surgery, L knee scope 2014.     PT Comments    The patient is progressing well.  Provided HEP and instruction on left leg positioning.  Follow Up Recommendations  Follow surgeon's recommendation for DC plan and follow-up therapies;Supervision for mobility/OOB;Outpatient PT     Equipment Recommendations  Rolling walker with 5" wheels    Recommendations for Other Services       Precautions / Restrictions Precautions Precautions: Fall;Knee Required Braces or Orthoses: Knee Immobilizer - Left Knee Immobilizer - Left: On when out of bed or walking;Discontinue once straight leg raise with < 10 degree lag Restrictions Other Position/Activity Restrictions: WBAT     Mobility  Bed Mobility   Bed Mobility: Supine to Sit;Sit to Supine     Supine to sit: Min guard Sit to supine: Min guard   General bed mobility comments: patient  used belt  for self assisting the   Transfers   Equipment used: Rolling walker (2 wheeled) Transfers: Sit to/from Stand Sit to Stand: Min guard         General transfer comment: Min guard for safety.   Ambulation/Gait Ambulation/Gait assistance: Min guard Gait Distance (Feet): 140 Feet Assistive device: Rolling walker (2 wheeled) Gait Pattern/deviations: Step-to pattern;Step-through pattern     General Gait Details: Verbal cuing for sequencing, placement in RW, turning. Min guard for safety.    Stairs             Wheelchair Mobility    Modified Rankin (Stroke Patients Only)       Balance                                            Cognition Arousal/Alertness: Awake/alert                                             Exercises Total Joint Exercises Ankle Circles/Pumps: AROM;Both;10 reps;Seated;Supine Quad Sets: AROM;Left;10 reps;Right;Supine Heel Slides: AAROM;Left;10 reps;Supine Hip ABduction/ADduction: AAROM;Left;10 reps;Supine Straight Leg Raises: AAROM;Left;10 reps;Supine    General Comments        Pertinent Vitals/Pain Pain Location: L knee  Pain Descriptors / Indicators: Constant;Aching Pain Intervention(s): Monitored during session;Premedicated before session;Ice applied;Repositioned    Home Living                      Prior Function            PT Goals (current goals can now be found in the care plan section) Progress towards PT goals: Progressing toward goals    Frequency    7X/week      PT Plan Current plan remains appropriate    Co-evaluation              AM-PAC PT "6 Clicks" Daily Activity  Outcome Measure  Difficulty turning over in bed (including adjusting bedclothes, sheets and blankets)?: A Little Difficulty moving from lying on back to sitting on the side of the bed? : A Little Difficulty sitting down on and standing up from  a chair with arms (e.g., wheelchair, bedside commode, etc,.)?: A Little Help needed moving to and from a bed to chair (including a wheelchair)?: A Little Help needed walking in hospital room?: A Little Help needed climbing 3-5 steps with a railing? : A Lot 6 Click Score: 17    End of Session Equipment Utilized During Treatment: Gait belt;Left knee immobilizer Activity Tolerance: Patient tolerated treatment well Patient left: in chair;with chair alarm set;with call bell/phone within reach;with SCD's reapplied Nurse Communication: Mobility status;Patient requests pain meds PT Visit Diagnosis: Other abnormalities of gait and mobility (R26.89);Difficulty in walking, not elsewhere classified (R26.2)     Time: 1610-9604 PT Time Calculation (min) (ACUTE ONLY): 54 min  Charges:  $Gait Training: 8-22 mins $Therapeutic  Exercise: 8-22 mins $Self Care/Home Management: 23-37                     Blanchard Kelch PT Acute Rehabilitation Services Pager [phone] Office [phone]    Rada Hay 06/12/2018, 3:02 PM

## 2018-06-12 NOTE — Progress Notes (Signed)
   Subjective: 1 Day Post-Op Procedure(s) (LRB): LEFT TOTAL KNEE ARTHROPLASTY (Left) Patient reports pain as moderate.   Patient seen in rounds by Dr. Lequita Halt. Patient is well, and has had no acute complaints or problems other than pain in the left knee. No issues overnight. Did well ambulating with therapy yesterday, will continue working with PT today. Denies chest pain, SOB or calf pain. Foley catheter removed this AM.   Objective: Vital signs in last 24 hours: Temp:  [97.5 F (36.4 C)-98.4 F (36.9 C)] 98.2 F (36.8 C) (10/08 0547) Pulse Rate:  [75-118] 98 (10/08 0547) Resp:  [9-20] 15 (10/07 2107) BP: (103-144)/(54-98) 144/98 (10/08 0547) SpO2:  [95 %-100 %] 99 % (10/08 0547)  Intake/Output from previous day:  Intake/Output Summary (Last 24 hours) at 06/12/2018 0743 Last data filed at 06/12/2018 0600 Gross per 24 hour  Intake 5754.03 ml  Output 6265 ml  Net -510.97 ml    Labs: Recent Labs    06/12/18 0512  HGB 12.5*   Recent Labs    06/12/18 0512  WBC 20.1*  RBC 4.18*  HCT 38.7*  PLT 310   Recent Labs    06/12/18 0512  NA 142  K 4.5  CL 107  CO2 28  BUN 18  CREATININE 1.02  GLUCOSE 175*  CALCIUM 8.6*   Exam: General - Patient is Alert and Oriented Extremity - Neurologically intact Neurovascular intact Sensation intact distally Dorsiflexion/Plantar flexion intact Dressing - dressing C/D/I Motor Function - intact, moving foot and toes well on exam.   Past Medical History:  Diagnosis Date  . Arthritis   . Asbestos exposure   . Asthma   . GERD (gastroesophageal reflux disease)   . Hypertension   . Muscle spasms of neck   . Seasonal allergies     Assessment/Plan: 1 Day Post-Op Procedure(s) (LRB): LEFT TOTAL KNEE ARTHROPLASTY (Left) Principal Problem:   OA (osteoarthritis) of knee  Estimated body mass index is 35.1 kg/m as calculated from the following:   Height as of this encounter: 6\' 2"  (1.88 m).   Weight as of this encounter: 124  kg. Advance diet Up with therapy  Anticipated LOS equal to or greater than 2 midnights due to - Age 60 and older with one or more of the following:  - Obesity  - Expected need for hospital services (PT, OT, Nursing) required for safe  discharge  - Anticipated need for postoperative skilled nursing care or inpatient rehab  - Active co-morbidities: Chronic pain requiring opiods OR   - Unanticipated findings during/Post Surgery: None  - Patient is a high risk of re-admission due to: None    DVT Prophylaxis - Aspirin Weight bearing as tolerated. D/C O2 and pulse ox and try on room air. Hemovac pulled without difficulty, will continue therapy today.  Plan is to go Home after hospital stay. Possible discharge tomorrow if progresses with therapy and meeting goals.   Arther Abbott, PA-C Orthopedic Surgery 06/12/2018, 7:43 AM

## 2018-06-13 LAB — BASIC METABOLIC PANEL
Anion gap: 9 (ref 5–15)
BUN: 19 mg/dL (ref 6–20)
CHLORIDE: 106 mmol/L (ref 98–111)
CO2: 28 mmol/L (ref 22–32)
Calcium: 8.7 mg/dL — ABNORMAL LOW (ref 8.9–10.3)
Creatinine, Ser: 0.88 mg/dL (ref 0.61–1.24)
GFR calc non Af Amer: >60 mL/min (ref >60)
Glucose, Bld: 156 mg/dL — ABNORMAL HIGH (ref 70–99)
POTASSIUM: 4 mmol/L (ref 3.5–5.1)
SODIUM: 143 mmol/L (ref 135–145)

## 2018-06-13 LAB — CBC
HCT: 37.8 % — ABNORMAL LOW (ref 39.0–52.0)
HEMOGLOBIN: 11.9 g/dL — AB (ref 13.0–17.0)
MCH: 29.7 pg (ref 26.0–34.0)
MCHC: 31.5 g/dL (ref 30.0–36.0)
MCV: 94.3 fL (ref 80.0–100.0)
Platelets: 325 10*3/uL (ref 150–400)
RBC: 4.01 MIL/uL — AB (ref 4.22–5.81)
RDW: 13.3 % (ref 11.5–15.5)
WBC: 18.8 10*3/uL — AB (ref 4.0–10.5)
nRBC: 0 % (ref 0.0–0.2)

## 2018-06-13 MED ORDER — OXYCODONE HCL 5 MG PO TABS: 5.0000 mg | ORAL_TABLET | Freq: Four times a day (QID) | ORAL | 0 refills | Status: DC | PRN

## 2018-06-13 MED ORDER — ASPIRIN 325 MG PO TBEC: 325.0000 mg | DELAYED_RELEASE_TABLET | Freq: Two times a day (BID) | ORAL | 0 refills | Status: AC

## 2018-06-13 MED ORDER — METHOCARBAMOL 500 MG PO TABS: 500.0000 mg | ORAL_TABLET | Freq: Four times a day (QID) | ORAL | 0 refills | Status: DC | PRN

## 2018-06-13 MED ORDER — GABAPENTIN 300 MG PO CAPS: 300.0000 mg | ORAL_CAPSULE | Freq: Three times a day (TID) | ORAL | 0 refills | Status: DC

## 2018-06-13 NOTE — Progress Notes (Signed)
   Subjective: 2 Days Post-Op Procedure(s) (LRB): LEFT TOTAL KNEE ARTHROPLASTY (Left) Patient reports pain as mild.   Patient seen in rounds with Dr. Lequita Halt. Patient is well, and has had no acute complaints or problems other than discomfort in the left knee. States he is ready to go home. Voiding without difficulty and positive flatus. Denies chest pain, SOB, or calf pain. Plan is to go Home after hospital stay.  Objective: Vital signs in last 24 hours: Temp:  [97.9 F (36.6 C)-98.4 F (36.9 C)] 98.2 F (36.8 C) (10/09 0532) Pulse Rate:  [83-100] 94 (10/09 0835) Resp:  [15-17] 17 (10/09 0532) BP: (113-142)/(69-90) 142/78 (10/09 0835) SpO2:  [95 %-97 %] 97 % (10/09 0532)  Intake/Output from previous day:  Intake/Output Summary (Last 24 hours) at 06/13/2018 0846 Last data filed at 06/13/2018 0833 Gross per 24 hour  Intake 2288.33 ml  Output 750 ml  Net 1538.33 ml    Intake/Output this shift: Total I/O In: 240 [P.O.:240] Out: -   Labs: Recent Labs    06/12/18 0512 06/13/18 0454  HGB 12.5* 11.9*   Recent Labs    06/12/18 0512 06/13/18 0454  WBC 20.1* 18.8*  RBC 4.18* 4.01*  HCT 38.7* 37.8*  PLT 310 325   Recent Labs    06/12/18 0512 06/13/18 0454  NA 142 143  K 4.5 4.0  CL 107 106  CO2 28 28  BUN 18 19  CREATININE 1.02 0.88  GLUCOSE 175* 156*  CALCIUM 8.6* 8.7*   Exam: General - Patient is Alert and Oriented Extremity - Neurologically intact Neurovascular intact Sensation intact distally Dorsiflexion/Plantar flexion intact Dressing/Incision - clean, dry, no drainage Motor Function - intact, moving foot and toes well on exam.   Past Medical History:  Diagnosis Date  . Arthritis   . Asbestos exposure   . Asthma   . GERD (gastroesophageal reflux disease)   . Hypertension   . Muscle spasms of neck   . Seasonal allergies     Assessment/Plan: 2 Days Post-Op Procedure(s) (LRB): LEFT TOTAL KNEE ARTHROPLASTY (Left) Principal Problem:   OA  (osteoarthritis) of knee  Estimated body mass index is 35.1 kg/m as calculated from the following:   Height as of this encounter: 6\' 2"  (1.88 m).   Weight as of this encounter: 124 kg. Up with therapy D/C IV fluids  DVT Prophylaxis - Aspirin Weight-bearing as tolerated  Plan for discharge after one session of therapy today. Follow-up in the office in 2 weeks with Dr. Lequita Halt. Scheduled for outpatient PT at [facility] in [place].  Arther Abbott, PA-C Orthopedic Surgery 06/13/2018, 8:46 AM

## 2018-06-13 NOTE — Progress Notes (Signed)
At 1100, D/C instructions and prescriptions were provided to the pt and pt's Wife. After discussing the pt's plan of care upon D/C home, the pt and pt's Wife reported no further questions or concerns.

## 2018-06-13 NOTE — Progress Notes (Signed)
Physical Therapy Treatment Patient Details Name: Raymond [name] MRN: [id number] DOB: 07/12/1956 Today's Date: 06/13/2018    History of Present Illness 62 YO male s/p L TKR on 06/11/18. PMH includes OA, HTN, carpal tunnel release bilat, cervical fusion, R trigger finger release surgery, L knee scope 2014.     PT Comments    The patient is experiencing increased pain of left knee. Instructed in steps. Patient will perform exercises after returns home.   Follow Up Recommendations  Follow surgeon's recommendation for DC plan and follow-up therapies;Supervision for mobility/OOB;Outpatient PT     Equipment Recommendations  Rolling walker with 5" wheels;None recommended by PT    Recommendations for Other Services       Precautions / Restrictions Precautions Precautions: Fall;Knee Required Braces or Orthoses: Knee Immobilizer - Left Knee Immobilizer - Left: On when out of bed or walking;Discontinue once straight leg raise with < 10 degree lag Restrictions Other Position/Activity Restrictions: WBAT     Mobility  Bed Mobility Overal bed mobility: Needs Assistance Bed Mobility: Supine to Sit;Sit to Supine     Supine to sit: Supervision Sit to supine: Supervision   General bed mobility comments: patient  used belt  for self assisting the left leg  Transfers     Transfers: Sit to/from Stand Sit to Stand: Supervision            Ambulation/Gait Ambulation/Gait assistance: Min guard Gait Distance (Feet): 50 Feet Assistive device: Rolling walker (2 wheeled) Gait Pattern/deviations: Step-to pattern;Antalgic     General Gait Details: Verbal cuing for sequencing, placement in RW, turning Min guard for safety. did not wear Left KI   Stairs Stairs: Yes Stairs assistance: Min assist Stair Management: One rail Left;Step to pattern;With cane;Forwards Number of Stairs: 2 General stair comments: instruction to use cand and 1 rail   Wheelchair Mobility    Modified Rankin  (Stroke Patients Only)       Balance                                            Cognition Arousal/Alertness: Awake/alert                                            Exercises      General Comments        Pertinent Vitals/Pain Pain Score: 4  Pain Location: L knee Pain Descriptors / Indicators: Tender;Constant Pain Intervention(s): Monitored during session;Premedicated before session    Home Living                      Prior Function            PT Goals (current goals can now be found in the care plan section) Progress towards PT goals: Progressing toward goals    Frequency    7X/week      PT Plan Current plan remains appropriate    Co-evaluation              AM-PAC PT "6 Clicks" Daily Activity  Outcome Measure  Difficulty turning over in bed (including adjusting bedclothes, sheets and blankets)?: None Difficulty moving from lying on back to sitting on the side of the bed? : None Difficulty sitting down on and standing up from a  chair with arms (e.g., wheelchair, bedside commode, etc,.)?: A Little Help needed moving to and from a bed to chair (including a wheelchair)?: A Little Help needed walking in hospital room?: A Little Help needed climbing 3-5 steps with a railing? : A Lot 6 Click Score: 19    End of Session Equipment Utilized During Treatment: Left knee immobilizer Activity Tolerance: Patient tolerated treatment well Patient left: in bed;with call bell/phone within reach Nurse Communication: Mobility status PT Visit Diagnosis: Other abnormalities of gait and mobility (R26.89);Difficulty in walking, not elsewhere classified (R26.2)     Time: 8295-6213 PT Time Calculation (min) (ACUTE ONLY): 33 min  Charges:  $Gait Training: 23-37 mins                     Blanchard Kelch PT Acute Rehabilitation Services Pager [phone] Office [phone]    Rada Hay 06/13/2018, 1:54  PM

## 2018-06-18 NOTE — Discharge Summary (Signed)
Physician Discharge Summary   Patient ID: Raymond [name] MRN: [id number] DOB/AGE: [birth year] 62 y.o.  Admit date: 06/11/2018 Discharge date: 06/13/2018  Primary Diagnosis: Osteoarthritis, left knee   Admission Diagnoses:  Past Medical History:  Diagnosis Date  . Arthritis   . Asbestos exposure   . Asthma   . GERD (gastroesophageal reflux disease)   . Hypertension   . Muscle spasms of neck   . Seasonal allergies    Discharge Diagnoses:   Principal Problem:   OA (osteoarthritis) of knee  Estimated body mass index is 35.1 kg/m as calculated from the following:   Height as of this encounter: '6\' 2"'$  (1.88 m).   Weight as of this encounter: 124 kg.  Procedure:  Procedure(s) (LRB): LEFT TOTAL KNEE ARTHROPLASTY (Left)   Consults: None  HPI:  Raymond [name] is a 62 y.o. year old male with end stage OA of his left knee with progressively worsening pain and dysfunction. He has constant pain, with activity and at rest and significant functional deficits with difficulties even with ADLs. He has had extensive non-op management including analgesics, injections of cortisone and viscosupplements, and home exercise program, but remains in significant pain with significant dysfunction. Radiographs show bone on bone arthritis medial and patellofemoral. He presents now for left Total Knee Arthroplasty.  Laboratory Data: Admission on 06/11/2018, Discharged on 06/13/2018  Component Date Value Ref Range Status  . WBC 06/12/2018 20.1* 4.0 - 10.5 K/uL Final  . RBC 06/12/2018 4.18* 4.22 - 5.81 MIL/uL Final  . Hemoglobin 06/12/2018 12.5* 13.0 - 17.0 g/dL Final  . HCT 06/12/2018 38.7* 39.0 - 52.0 % Final  . MCV 06/12/2018 92.6  80.0 - 100.0 fL Final  . MCH 06/12/2018 29.9  26.0 - 34.0 pg Final  . MCHC 06/12/2018 32.3  30.0 - 36.0 g/dL Final  . RDW 06/12/2018 13.6  11.5 - 15.5 % Final  . Platelets 06/12/2018 310  150 - 400 K/uL Final   Performed at [facility], [place] [address] [place], [place] [address]  . Sodium 06/12/2018 142  135 - 145 mmol/L Final  . Potassium 06/12/2018 4.5  3.5 - 5.1 mmol/L Final  . Chloride 06/12/2018 107  98 - 111 mmol/L Final  . CO2 06/12/2018 28  22 - 32 mmol/L Final  . Glucose, Bld 06/12/2018 175* 70 - 99 mg/dL Final  . BUN 06/12/2018 18  6 - 20 mg/dL Final  . Creatinine, Ser 06/12/2018 1.02  0.61 - 1.24 mg/dL Final  . Calcium 06/12/2018 8.6* 8.9 - 10.3 mg/dL Final  . GFR calc non Af Amer 06/12/2018 >60  >60 mL/min Final  . GFR calc Af Amer 06/12/2018 >60  >60 mL/min Final   Comment: (NOTE) The eGFR has been calculated using the CKD EPI equation. This calculation has not been validated in all clinical situations. eGFR's persistently <60 mL/min signify possible Chronic Kidney Disease.   Georgiann Hahn gap 06/12/2018 7  5 - 15 Final   Performed at [facility], [place] [address], [place], [place] [address]  . WBC 06/13/2018 18.8* 4.0 - 10.5 K/uL Final  . RBC 06/13/2018 4.01* 4.22 - 5.81 MIL/uL Final  . Hemoglobin 06/13/2018 11.9* 13.0 - 17.0 g/dL Final  . HCT 06/13/2018 37.8* 39.0 - 52.0 % Final  . MCV 06/13/2018 94.3  80.0 - 100.0 fL Final  . MCH 06/13/2018 29.7  26.0 - 34.0 pg Final  . MCHC 06/13/2018 31.5  30.0 - 36.0 g/dL Final  . RDW  06/13/2018 13.3  11.5 - 15.5 % Final  . Platelets 06/13/2018 325  150 - 400 K/uL Final  . nRBC 06/13/2018 0.0  0.0 - 0.2 % Final   Performed at [facility], [place] [address], [place], [place] [address]  . Sodium 06/13/2018 143  135 - 145 mmol/L Final  . Potassium 06/13/2018 4.0  3.5 - 5.1 mmol/L Final  . Chloride 06/13/2018 106  98 - 111 mmol/L Final  . CO2 06/13/2018 28  22 - 32 mmol/L Final  . Glucose, Bld 06/13/2018 156* 70 - 99 mg/dL Final  . BUN 06/13/2018 19  6 - 20 mg/dL Final  . Creatinine, Ser 06/13/2018 0.88  0.61 - 1.24 mg/dL Final  . Calcium 06/13/2018 8.7* 8.9 - 10.3 mg/dL Final  . GFR calc non Af Amer 06/13/2018 >60  >60 mL/min Final  . GFR calc Af  Amer 06/13/2018 >60  >60 mL/min Final   Comment: (NOTE) The eGFR has been calculated using the CKD EPI equation. This calculation has not been validated in all clinical situations. eGFR's persistently <60 mL/min signify possible Chronic Kidney Disease.   Georgiann Hahn gap 06/13/2018 9  5 - 15 Final   Performed at [facility], [place] [address], [place], [place] [address]  Hospital Outpatient Visit on 06/06/2018  Component Date Value Ref Range Status  . MRSA, PCR 06/06/2018 NEGATIVE  NEGATIVE Final  . Staphylococcus aureus 06/06/2018 NEGATIVE  NEGATIVE Final   Comment: (NOTE) The Xpert SA Assay (FDA approved for NASAL specimens in patients 68 years of age and older), is one component of a comprehensive surveillance program. It is not intended to diagnose infection nor to guide or monitor treatment. Performed at [facility], [place] [address] [place], [place] [address]   . aPTT 06/06/2018 26  24 - 36 seconds Final   Performed at [facility], [place] [address], [place], [place] [address]  . WBC 06/06/2018 8.0  4.0 - 10.5 K/uL Final  . RBC 06/06/2018 4.87  4.22 - 5.81 MIL/uL Final  . Hemoglobin 06/06/2018 14.6  13.0 - 17.0 g/dL Final  . HCT 06/06/2018 44.6  39.0 - 52.0 % Final  . MCV 06/06/2018 91.6  78.0 - 100.0 fL Final  . MCH 06/06/2018 30.0  26.0 - 34.0 pg Final  . MCHC 06/06/2018 32.7  30.0 - 36.0 g/dL Final  . RDW 06/06/2018 13.3  11.5 - 15.5 % Final  . Platelets 06/06/2018 365  150 - 400 K/uL Final   Performed at [facility], [place] [address], [place], [place] [address]  . Sodium 06/06/2018 143  135 - 145 mmol/L Final  . Potassium 06/06/2018 4.6  3.5 - 5.1 mmol/L Final  . Chloride 06/06/2018 103  98 - 111 mmol/L Final  . CO2 06/06/2018 30  22 - 32 mmol/L Final  . Glucose, Bld 06/06/2018 140* 70 - 99 mg/dL Final  . BUN 06/06/2018 22* 6 - 20 mg/dL Final  . Creatinine, Ser 06/06/2018 1.19  0.61 - 1.24 mg/dL Final    . Calcium 06/06/2018 9.7  8.9 - 10.3 mg/dL Final  . Total Protein 06/06/2018 7.2  6.5 - 8.1 g/dL Final  . Albumin 06/06/2018 4.3  3.5 - 5.0 g/dL Final  . AST 06/06/2018 24  15 - 41 U/L Final  . ALT 06/06/2018 41  0 - 44 U/L Final  . Alkaline Phosphatase 06/06/2018 51  38 - 126 U/L Final  . Total Bilirubin 06/06/2018 0.6  0.3 - 1.2 mg/dL Final  . GFR calc non Af Amer 06/06/2018 >60  >60 mL/min Final  . GFR calc Af Amer 06/06/2018 >60  >60 mL/min Final   Comment: (NOTE) The eGFR has been calculated using the CKD EPI equation. This calculation has not been validated in all clinical situations. eGFR's persistently <60 mL/min signify possible Chronic Kidney Disease.   Georgiann Hahn gap 06/06/2018 10  5 - 15 Final   Performed at [facility], [place] [address], [place], [place] [address]  . Prothrombin Time 06/06/2018 11.6  11.4 - 15.2 seconds Final  . INR 06/06/2018 0.86   Final   Performed at [facility], [place] [address], [place], [place] [address]  . ABO/RH(D) 06/06/2018 A POS   Final  . Antibody Screen 06/06/2018 NEG   Final  . Sample Expiration 06/06/2018 06/14/2018   Final  . Extend sample reason 06/06/2018    Final                   Value:NO TRANSFUSIONS OR PREGNANCY IN THE PAST 3 MONTHS Performed at [facility], [place] [address], [place], [place] [address]   . ABO/RH(D) 06/06/2018    Final                   Value:A POS Performed at [facility], [place] [address], [place], [place] [address]      X-Rays:No results found.  EKG: Orders placed or performed during the hospital encounter of 06/06/18  . EKG 12 lead  . EKG 12 lead     Hospital Course: Raymond [name] is a 62 y.o. who was admitted to [facility]. They were brought to the operating room on 06/11/2018 and underwent Procedure(s): LEFT TOTAL KNEE ARTHROPLASTY.  Patient tolerated the procedure well and was later transferred to the recovery room and  then to the orthopaedic floor for postoperative care. They were given PO and IV analgesics for pain control following their surgery. They were given 24 hours of postoperative antibiotics of  Anti-infectives (From admission, onward)   Start     Dose/Rate Route Frequency Ordered Stop   06/11/18 1400  ceFAZolin (ANCEF) IVPB 2g/100 mL premix     2 g 200 mL/hr over 30 Minutes Intravenous Every 6 hours 06/11/18 1131 06/11/18 2023   06/11/18 0600  ceFAZolin (ANCEF) 3 g in dextrose 5 % 50 mL IVPB     3 g 100 mL/hr over 30 Minutes Intravenous On call to O.R. 06/10/18 1102 06/11/18 0837     and started on DVT prophylaxis in the form of Aspirin.   PT and OT were ordered for total joint protocol. Discharge planning consulted to help with postop disposition and equipment needs. Patient had a good night on the evening of surgery. They started to get up OOB with therapy on POD #1. Hemovac drain was pulled without difficulty on day one. Continued to work with therapy into POD #2. Pt was seen during rounds on day two and was ready to go home pending progress with therapy. Dressing was changed and the incision was clean, dry, and intact with no drainage. Pt worked with therapy for one additional session and was meeting their goals. He was discharged to home later that day in stable condition.  Diet: Regular diet Activity: WBAT Follow-up: in 2 weeks with Dr. Wynelle Link Disposition: Home with outpatient physical therapy at [facility] in [place] Discharged Condition: stable   Discharge Instructions  Call MD / Call 911   Complete by:  As directed    If you experience chest pain or shortness of breath, CALL 911 and be transported to the hospital emergency room.  If you develope a fever above 101 F, pus (white drainage) or increased drainage or redness at the wound, or calf pain, call your surgeon's office.   Change dressing   Complete by:  As directed    Change the dressing daily with sterile 4 x 4 inch  gauze dressing and apply TED hose.   Constipation Prevention   Complete by:  As directed    Drink plenty of fluids.  Prune juice may be helpful.  You may use a stool softener, such as Colace (over the counter) 100 mg twice a day.  Use MiraLax (over the counter) for constipation as needed.   Diet - low sodium heart healthy   Complete by:  As directed    Discharge instructions   Complete by:  As directed    Dr. Gaynelle Arabian Total Joint Specialist [facility] [address] [place], [place] [address] [phone]  TOTAL KNEE REPLACEMENT POSTOPERATIVE DIRECTIONS  Knee Rehabilitation, Guidelines Following Surgery  Results after knee surgery are often greatly improved when you follow the exercise, range of motion and muscle strengthening exercises prescribed by your doctor. Safety measures are also important to protect the knee from further injury. Any time any of these exercises cause you to have increased pain or swelling in your knee joint, decrease the amount until you are comfortable again and slowly increase them. If you have problems or questions, call your caregiver or physical therapist for advice.   HOME CARE INSTRUCTIONS  Remove items at home which could result in a fall. This includes throw rugs or furniture in walking pathways.  ICE to the affected knee every three hours for 30 minutes at a time and then as needed for pain and swelling.  Continue to use ice on the knee for pain and swelling from surgery. You may notice swelling that will progress down to the foot and ankle.  This is normal after surgery.  Elevate the leg when you are not up walking on it.   Continue to use the breathing machine which will help keep your temperature down.  It is common for your temperature to cycle up and down following surgery, especially at night when you are not up moving around and exerting yourself.  The breathing machine keeps your lungs expanded and your temperature down. Do not  place pillow under knee, focus on keeping the knee straight while resting   DIET You may resume your previous home diet once your are discharged from the hospital.  DRESSING / WOUND CARE / SHOWERING You may shower 3 days after surgery, but keep the wounds dry during showering.  You may use an occlusive plastic wrap (Press'n Seal for example), NO SOAKING/SUBMERGING IN THE BATHTUB.  If the bandage gets wet, change with a clean dry gauze.  If the incision gets wet, pat the wound dry with a clean towel. You may start showering once you are discharged home but do not submerge the incision under water. Just pat the incision dry and apply a dry gauze dressing on daily. Change the surgical dressing daily and reapply a dry dressing each time.  ACTIVITY Walk with your walker as instructed. Use walker as long as suggested by your caregivers. Avoid periods of inactivity such as sitting longer than an hour when not  asleep. This helps prevent blood clots.  You may resume a sexual relationship in one month or when given the OK by your doctor.  You may return to work once you are cleared by your doctor.  Do not drive a car for 6 weeks or until released by you surgeon.  Do not drive while taking narcotics.  WEIGHT BEARING Weight bearing as tolerated with assist device (walker, cane, etc) as directed, use it as long as suggested by your surgeon or therapist, typically at least 4-6 weeks.  POSTOPERATIVE CONSTIPATION PROTOCOL Constipation - defined medically as fewer than three stools per week and severe constipation as less than one stool per week.  One of the most common issues patients have following surgery is constipation.  Even if you have a regular bowel pattern at home, your normal regimen is likely to be disrupted due to multiple reasons following surgery.  Combination of anesthesia, postoperative narcotics, change in appetite and fluid intake all can affect your bowels.  In order to avoid  complications following surgery, here are some recommendations in order to help you during your recovery period.  Colace (docusate) - Pick up an over-the-counter form of Colace or another stool softener and take twice a day as long as you are requiring postoperative pain medications.  Take with a full glass of water daily.  If you experience loose stools or diarrhea, hold the colace until you stool forms back up.  If your symptoms do not get better within 1 week or if they get worse, check with your doctor.  Dulcolax (bisacodyl) - Pick up over-the-counter and take as directed by the product packaging as needed to assist with the movement of your bowels.  Take with a full glass of water.  Use this product as needed if not relieved by Colace only.   MiraLax (polyethylene glycol) - Pick up over-the-counter to have on hand.  MiraLax is a solution that will increase the amount of water in your bowels to assist with bowel movements.  Take as directed and can mix with a glass of water, juice, soda, coffee, or tea.  Take if you go more than two days without a movement. Do not use MiraLax more than once per day. Call your doctor if you are still constipated or irregular after using this medication for 7 days in a row.  If you continue to have problems with postoperative constipation, please contact the office for further assistance and recommendations.  If you experience "the worst abdominal pain ever" or develop nausea or vomiting, please contact the office immediatly for further recommendations for treatment.  ITCHING  If you experience itching with your medications, try taking only a single pain pill, or even half a pain pill at a time.  You can also use Benadryl over the counter for itching or also to help with sleep.   TED HOSE STOCKINGS Wear the elastic stockings on both legs for three weeks following surgery during the day but you may remove then at night for sleeping.  MEDICATIONS See your  medication summary on the "After Visit Summary" that the nursing staff will review with you prior to discharge.  You may have some home medications which will be placed on hold until you complete the course of blood thinner medication.  It is important for you to complete the blood thinner medication as prescribed by your surgeon.  Continue your approved medications as instructed at time of discharge.  PRECAUTIONS If you experience chest pain or shortness  of breath - call 911 immediately for transfer to the hospital emergency department.  If you develop a fever greater that 101 F, purulent drainage from wound, increased redness or drainage from wound, foul odor from the wound/dressing, or calf pain - CONTACT YOUR SURGEON.                                                   FOLLOW-UP APPOINTMENTS Make sure you keep all of your appointments after your operation with your surgeon and caregivers. You should call the office at the above phone number and make an appointment for approximately two weeks after the date of your surgery or on the date instructed by your surgeon outlined in the "After Visit Summary".   RANGE OF MOTION AND STRENGTHENING EXERCISES  Rehabilitation of the knee is important following a knee injury or an operation. After just a few days of immobilization, the muscles of the thigh which control the knee become weakened and shrink (atrophy). Knee exercises are designed to build up the tone and strength of the thigh muscles and to improve knee motion. Often times heat used for twenty to thirty minutes before working out will loosen up your tissues and help with improving the range of motion but do not use heat for the first two weeks following surgery. These exercises can be done on a training (exercise) mat, on the floor, on a table or on a bed. Use what ever works the best and is most comfortable for you Knee exercises include:  Leg Lifts - While your knee is still immobilized in a splint or  cast, you can do straight leg raises. Lift the leg to 60 degrees, hold for 3 sec, and slowly lower the leg. Repeat 10-20 times 2-3 times daily. Perform this exercise against resistance later as your knee gets better.  Quad and Hamstring Sets - Tighten up the muscle on the front of the thigh (Quad) and hold for 5-10 sec. Repeat this 10-20 times hourly. Hamstring sets are done by pushing the foot backward against an object and holding for 5-10 sec. Repeat as with quad sets.  Leg Slides: Lying on your back, slowly slide your foot toward your buttocks, bending your knee up off the floor (only go as far as is comfortable). Then slowly slide your foot back down until your leg is flat on the floor again. Angel Wings: Lying on your back spread your legs to the side as far apart as you can without causing discomfort.  A rehabilitation program following serious knee injuries can speed recovery and prevent re-injury in the future due to weakened muscles. Contact your doctor or a physical therapist for more information on knee rehabilitation.   IF YOU ARE TRANSFERRED TO A SKILLED REHAB FACILITY If the patient is transferred to a skilled rehab facility following release from the hospital, a list of the current medications will be sent to the facility for the patient to continue.  When discharged from the skilled rehab facility, please have the facility set up the patient's [place] prior to being released. Also, the skilled facility will be responsible for providing the patient with their medications at time of release from the facility to include their pain medication, the muscle relaxants, and their blood thinner medication. If the patient is still at the rehab facility at time  of the two week follow up appointment, the skilled rehab facility will also need to assist the patient in arranging follow up appointment in our office and any transportation needs.  MAKE SURE YOU:  Understand these  instructions.  Get help right away if you are not doing well or get worse.    Pick up stool softner and laxative for home use following surgery while on pain medications. Do not submerge incision under water. Please use good hand washing techniques while changing dressing each day. May shower starting three days after surgery. Please use a clean towel to pat the incision dry following showers. Continue to use ice for pain and swelling after surgery. Do not use any lotions or creams on the incision until instructed by your surgeon.   Do not put a pillow under the knee. Place it under the heel.   Complete by:  As directed    Driving restrictions   Complete by:  As directed    No driving for two weeks   TED hose   Complete by:  As directed    Use stockings (TED hose) for three weeks on both leg(s).  You may remove them at night for sleeping.   Weight bearing as tolerated   Complete by:  As directed      Allergies as of 06/13/2018      Reactions   Irbesartan    Unknown reaction       Medication List    STOP taking these medications   HYDROcodone-acetaminophen 10-325 MG tablet Commonly known as:  NORCO   ibuprofen 200 MG tablet Commonly known as:  ADVIL,MOTRIN     TAKE these medications   albuterol 108 (90 Base) MCG/ACT inhaler Commonly known as:  PROVENTIL HFA;VENTOLIN HFA Inhale 2 puffs into the lungs every 4 (four) hours as needed for wheezing or shortness of breath.   amLODipine 10 MG tablet Commonly known as:  NORVASC Take 10 mg by mouth daily.   aspirin 325 MG EC tablet Take 1 tablet (325 mg total) by mouth 2 (two) times daily for 19 days. Take one tablet (325 mg) Aspirin two times a day for three weeks following surgery. Then take one baby Aspirin (81 mg) once a day for three weeks. Then discontinue aspirin.   atorvastatin 10 MG tablet Commonly known as:  LIPITOR Take 10 mg by mouth at bedtime.   budesonide 180 MCG/ACT inhaler Commonly known as:   PULMICORT Inhale 1 puff into the lungs 2 (two) times daily as needed (shortness of breath).   diazepam 5 MG tablet Commonly known as:  VALIUM Take 5 mg by mouth every 8 (eight) hours as needed for muscle spasms.   fluticasone 50 MCG/ACT nasal spray Commonly known as:  FLONASE Place 1 spray into both nostrils daily as needed for allergies or rhinitis.   gabapentin 300 MG capsule Commonly known as:  NEURONTIN Take 1 capsule (300 mg total) by mouth every 8 (eight) hours. Gabapentin 300 mg Protocol Take a 300 mg capsule three times a day for two weeks following surgery. Then take a 300 mg capsule two times a day for two weeks.  Then take a 300 mg capsule once a day for two weeks.  Then discontinue the Gabapentin.   lisinopril-hydrochlorothiazide 20-25 MG tablet Commonly known as:  PRINZIDE,ZESTORETIC Take 1 tablet by mouth daily.   methocarbamol 500 MG tablet Commonly known as:  ROBAXIN Take 1 tablet (500 mg total) by mouth every 6 (six) hours as needed for  muscle spasms.   montelukast 10 MG tablet Commonly known as:  SINGULAIR Take 10 mg by mouth at bedtime.   omeprazole 40 MG capsule Commonly known as:  PRILOSEC Take 40 mg by mouth daily.   oxyCODONE 5 MG immediate release tablet Commonly known as:  Oxy IR/ROXICODONE Take 1-2 tablets (5-10 mg total) by mouth every 6 (six) hours as needed for moderate pain (pain score 4-6).   ranitidine 150 MG tablet Commonly known as:  ZANTAC Take 150 mg by mouth at bedtime.   zolpidem 10 MG tablet Commonly known as:  AMBIEN Take 10 mg by mouth at bedtime as needed for sleep.            Discharge Care Instructions  (From admission, onward)         Start     Ordered   06/13/18 0000  Weight bearing as tolerated     06/13/18 0850   06/13/18 0000  Change dressing    Comments:  Change the dressing daily with sterile 4 x 4 inch gauze dressing and apply TED hose.   06/13/18 0850         Follow-up Information    Gaynelle Arabian, MD. Schedule an appointment as soon as possible for a visit on 06/26/2018.   Specialty:  Orthopedic Surgery Contact information: [address] [place] [address] [phone]           Signed: Theresa Duty, PA-C Orthopedic Surgery 06/18/2018, 8:02 AM

## 2018-06-22 ENCOUNTER — Telehealth: Payer: Self-pay

## 2018-06-22 NOTE — Telephone Encounter (Signed)
Received referral from Dr. Mechele Collin for EUS for polyp removal. Called and spoke with spouse, Raymond [name], as requested. EUS scheduled for 10/31 with Dr. Chevis Pretty at [facility]. Takes aspirin 325mg  daily as only anticoagulant. Denies diabetes. Went over instruction for EUS and copy mailed to home address. They are seeing orthopedist on Tuesday and will confirm that this date is okay since he had knee replacement on 10/7.  INSTRUCTIONS FOR ENDOSCOPIC ULTRASOUND -Your procedure has been scheduled for October 31st with Dr. Chevis Pretty at  [facility] -The hospital may contact you to pre-register over the phone.  -To get your scheduled arrival time, please call the Endoscopy unit at  [phone] between 1-3 p.m. on:  October 30th   -ON THE DAY OF YOU PROCEDURE:   1. If you are scheduled for a morning procedure, nothing to drink after midnight  -If you are scheduled for an afternoon procedure, you may have clear liquids until 5 hours prior  to the procedure but no carbonated drinks or broth  2. NO FOOD THE DAY OF YOUR PROCEDURE  3. You may take your heart, seizure, blood pressure, Parkinson's or breathing medications at  6am with just enough water to get your pills down  4. Do not take any oral Diabetic medications the morning of your procedure.  5. If you are a diabetic and are using insulin, please notify your prescribing physician of this  procedure, as your dose may need to be altered related to not being able to eat or drink.   6. Do not take vitamins, iron, or fish oil for 5 days before your procedure     -On the day of your procedure, come to the [facility] Admitting/Registration desk (First desk on the right) at the scheduled arrival time. You MUST have someone drive you home from your procedure. You must have a responsible adult with a valid driver's license who is on site throughout your entire procedure and who can stay with you for several hours after your procedure.  You may not go home alone in a taxi, shuttle [place] or bus, as the drivers will not be responsible for you.  --If you have any questions please call me at the above contact    Oncology Nurse Navigator Documentation  Navigator Location: [facility] (06/22/18 1500)   )Navigator Encounter Type: Telephone (06/22/18 1500) Telephone: Raymond [name] Call (06/22/18 1500)                       Barriers/Navigation Needs: Coordination of Care (06/22/18 1500)   Interventions: Coordination of Care (06/22/18 1500)   Coordination of Care: EUS (06/22/18 1500)                  Time Spent with Patient: 30 (06/22/18 1500)

## 2018-06-29 ENCOUNTER — Telehealth: Payer: Self-pay

## 2018-06-29 NOTE — Telephone Encounter (Signed)
Received call from spouse, [name]. Raymond [name] has seen his orthopedist for follow up. He recommended waiting until mid-late November for EUS. EUS rescheduled for first available, December 5th. Endo and [facility] notified of date change. Oncology Nurse Navigator Documentation  Navigator Location: [facility] (06/29/18 0800)   )Navigator Encounter Type: Telephone (06/29/18 0800) Telephone: Incoming Call;Patient Update (06/29/18 0800)                                                  Time Spent with Patient: 15 (06/29/18 0800)

## 2018-07-31 ENCOUNTER — Telehealth: Payer: Self-pay

## 2018-07-31 NOTE — Telephone Encounter (Signed)
[facility]. [facility] will contact Raymond [name] with date/time/instrucitions. Oncology Nurse Navigator Documentation  Navigator Location: [facility] (07/31/18 1600)   )Navigator Encounter Type: Telephone (07/31/18 1600)                         Barriers/Navigation Needs: Coordination of Care (07/31/18 1600)   Interventions: Coordination of Care (07/31/18 1600)   Coordination of Care: EUS (07/31/18 1600)                  Time Spent with Patient: 15 (07/31/18 1600)

## 2018-08-09 ENCOUNTER — Ambulatory Visit: Admission: RE | Admit: 2018-08-09 | Source: Ambulatory Visit | Admitting: Internal Medicine

## 2018-08-09 ENCOUNTER — Encounter: Admission: RE | Payer: Self-pay | Source: Ambulatory Visit

## 2018-08-09 SURGERY — UPPER ENDOSCOPIC ULTRASOUND (EUS) RADIAL
Anesthesia: General

## 2019-05-27 ENCOUNTER — Other Ambulatory Visit: Payer: Self-pay | Admitting: Internal Medicine

## 2019-05-27 DIAGNOSIS — Z77090 Contact with and (suspected) exposure to asbestos: Secondary | ICD-10-CM

## 2019-06-06 ENCOUNTER — Other Ambulatory Visit: Payer: Self-pay

## 2019-06-06 ENCOUNTER — Ambulatory Visit
Admission: RE | Admit: 2019-06-06 | Discharge: 2019-06-06 | Disposition: A | Discharge status: Home / Self Care | Payer: BC Managed Care – PPO | Source: Ambulatory Visit | Attending: Internal Medicine | Admitting: Internal Medicine

## 2019-06-06 DIAGNOSIS — Z77090 Contact with and (suspected) exposure to asbestos: Secondary | ICD-10-CM | POA: Insufficient documentation

## 2019-06-06 MED ORDER — IOHEXOL 300 MG/ML  SOLN
75.0000 mL | Freq: Once | INTRAMUSCULAR | Status: AC | PRN
  Administered 2019-06-06: 75 mL via INTRAVENOUS

## 2019-06-30 ENCOUNTER — Emergency Department
Admission: EM | Admit: 2019-06-30 | Discharge: 2019-06-30 | Disposition: A | Discharge status: Home / Self Care | Payer: BC Managed Care – PPO | Attending: Emergency Medicine | Admitting: Emergency Medicine

## 2019-06-30 ENCOUNTER — Encounter: Payer: Self-pay | Admitting: Emergency Medicine

## 2019-06-30 ENCOUNTER — Other Ambulatory Visit: Payer: Self-pay

## 2019-06-30 ENCOUNTER — Emergency Department: Payer: BC Managed Care – PPO

## 2019-06-30 DIAGNOSIS — R509 Fever, unspecified: Secondary | ICD-10-CM | POA: Diagnosis present

## 2019-06-30 DIAGNOSIS — R5383 Other fatigue: Secondary | ICD-10-CM | POA: Diagnosis not present

## 2019-06-30 DIAGNOSIS — B349 Viral infection, unspecified: Secondary | ICD-10-CM | POA: Diagnosis not present

## 2019-06-30 DIAGNOSIS — Z79899 Other long term (current) drug therapy: Secondary | ICD-10-CM | POA: Diagnosis not present

## 2019-06-30 DIAGNOSIS — J45909 Unspecified asthma, uncomplicated: Secondary | ICD-10-CM | POA: Diagnosis not present

## 2019-06-30 DIAGNOSIS — Z87891 Personal history of nicotine dependence: Secondary | ICD-10-CM | POA: Diagnosis not present

## 2019-06-30 DIAGNOSIS — I10 Essential (primary) hypertension: Secondary | ICD-10-CM | POA: Insufficient documentation

## 2019-06-30 DIAGNOSIS — Z96652 Presence of left artificial knee joint: Secondary | ICD-10-CM | POA: Insufficient documentation

## 2019-06-30 DIAGNOSIS — Z20828 Contact with and (suspected) exposure to other viral communicable diseases: Secondary | ICD-10-CM | POA: Diagnosis not present

## 2019-06-30 DIAGNOSIS — Z20822 Contact with and (suspected) exposure to covid-19: Secondary | ICD-10-CM

## 2019-06-30 DIAGNOSIS — R0602 Shortness of breath: Secondary | ICD-10-CM

## 2019-06-30 LAB — COMPREHENSIVE METABOLIC PANEL
ALT: 33 U/L (ref 0–44)
AST: 17 U/L (ref 15–41)
Albumin: 4 g/dL (ref 3.5–5.0)
Alkaline Phosphatase: 45 U/L (ref 38–126)
Anion gap: 12 (ref 5–15)
BUN: 24 mg/dL — ABNORMAL HIGH (ref 8–23)
CO2: 25 mmol/L (ref 22–32)
Calcium: 8.8 mg/dL — ABNORMAL LOW (ref 8.9–10.3)
Chloride: 99 mmol/L (ref 98–111)
Creatinine, Ser: 1.23 mg/dL (ref 0.61–1.24)
GFR calc Af Amer: >60 mL/min (ref >60)
GFR calc non Af Amer: >60 mL/min (ref >60)
Glucose, Bld: 179 mg/dL — ABNORMAL HIGH (ref 70–99)
Potassium: 3.4 mmol/L — ABNORMAL LOW (ref 3.5–5.1)
Sodium: 136 mmol/L (ref 135–145)
Total Bilirubin: 0.5 mg/dL (ref 0.3–1.2)
Total Protein: 7.4 g/dL (ref 6.5–8.1)

## 2019-06-30 LAB — CBC WITH DIFFERENTIAL/PLATELET
Abs Immature Granulocytes: 0.1 10*3/uL — ABNORMAL HIGH (ref 0.00–0.07)
Basophils Absolute: 0.1 10*3/uL (ref 0.0–0.1)
Basophils Relative: 0 %
Eosinophils Absolute: 0.3 10*3/uL (ref 0.0–0.5)
Eosinophils Relative: 2 %
HCT: 41.4 % (ref 39.0–52.0)
Hemoglobin: 13.4 g/dL (ref 13.0–17.0)
Immature Granulocytes: 1 %
Lymphocytes Relative: 12 %
Lymphs Abs: 2.1 10*3/uL (ref 0.7–4.0)
MCH: 28.9 pg (ref 26.0–34.0)
MCHC: 32.4 g/dL (ref 30.0–36.0)
MCV: 89.4 fL (ref 80.0–100.0)
Monocytes Absolute: 2 10*3/uL — ABNORMAL HIGH (ref 0.1–1.0)
Monocytes Relative: 12 %
Neutro Abs: 12.3 10*3/uL — ABNORMAL HIGH (ref 1.7–7.7)
Neutrophils Relative %: 73 %
Platelets: 286 10*3/uL (ref 150–400)
RBC: 4.63 MIL/uL (ref 4.22–5.81)
RDW: 13.8 % (ref 11.5–15.5)
WBC: 16.8 10*3/uL — ABNORMAL HIGH (ref 4.0–10.5)
nRBC: 0 % (ref 0.0–0.2)

## 2019-06-30 LAB — SARS CORONAVIRUS 2 (TAT 6-24 HRS): SARS Coronavirus 2: NEGATIVE

## 2019-06-30 LAB — LACTIC ACID, PLASMA: Lactic Acid, Venous: 1.2 mmol/L (ref 0.5–1.9)

## 2019-06-30 LAB — URINALYSIS, ROUTINE W REFLEX MICROSCOPIC
Bilirubin Urine: NEGATIVE
Glucose, UA: 50 mg/dL — AB
Hgb urine dipstick: NEGATIVE
Ketones, ur: NEGATIVE mg/dL
Leukocytes,Ua: NEGATIVE
Nitrite: NEGATIVE
Protein, ur: NEGATIVE mg/dL
Specific Gravity, Urine: 1.025 (ref 1.005–1.030)
pH: 5 (ref 5.0–8.0)

## 2019-06-30 LAB — TROPONIN I (HIGH SENSITIVITY)
Troponin I (High Sensitivity): 13 ng/L (ref <18)
Troponin I (High Sensitivity): 11 ng/L (ref <18)

## 2019-06-30 MED ORDER — DEXAMETHASONE SODIUM PHOSPHATE 10 MG/ML IJ SOLN
8.0000 mg | Freq: Once | INTRAMUSCULAR | Status: AC
  Administered 2019-06-30: 8 mg via INTRAVENOUS
  Filled 2019-06-30: qty 1

## 2019-06-30 MED ORDER — AZITHROMYCIN 250 MG PO TABS: ORAL_TABLET | ORAL | 0 refills | Status: DC

## 2019-06-30 MED ORDER — AZITHROMYCIN 500 MG PO TABS
500.0000 mg | ORAL_TABLET | Freq: Once | ORAL | Status: AC
  Administered 2019-06-30: 500 mg via ORAL
  Filled 2019-06-30: qty 1

## 2019-06-30 MED ORDER — DEXAMETHASONE 4 MG PO TABS
8.0000 mg | ORAL_TABLET | Freq: Once | ORAL | Status: DC
  Filled 2019-06-30: qty 2

## 2019-06-30 MED ORDER — IBUPROFEN 600 MG PO TABS
600.0000 mg | ORAL_TABLET | ORAL | Status: AC
  Administered 2019-06-30: 600 mg via ORAL
  Filled 2019-06-30: qty 1

## 2019-06-30 MED ORDER — SODIUM CHLORIDE 0.9 % IV BOLUS
1000.0000 mL | Freq: Once | INTRAVENOUS | Status: AC
  Administered 2019-06-30: 1000 mL via INTRAVENOUS

## 2019-06-30 MED ORDER — ONDANSETRON 4 MG PO TBDP: 4.0000 mg | ORAL_TABLET | Freq: Four times a day (QID) | ORAL | 0 refills | Status: DC | PRN

## 2019-06-30 MED ORDER — HYDROCODONE-ACETAMINOPHEN 5-325 MG PO TABS
2.0000 | ORAL_TABLET | Freq: Once | ORAL | Status: AC
  Administered 2019-06-30: 2 via ORAL
  Filled 2019-06-30: qty 2

## 2019-06-30 MED ORDER — ALBUTEROL SULFATE HFA 108 (90 BASE) MCG/ACT IN AERS: 2.0000 | INHALATION_SPRAY | RESPIRATORY_TRACT | Status: DC

## 2019-06-30 NOTE — ED Notes (Signed)
Report to [place] and mac, rn.

## 2019-06-30 NOTE — ED Notes (Signed)
Pt instructed per dr. Cherylann Banas to take two puffs of his personal albuterol inhaler. Pt complied.

## 2019-06-30 NOTE — ED Provider Notes (Signed)
[facility] Emergency Department Provider Note   ____________________________________________   First MD Initiated Contact with Patient 06/30/19 [phone]     (approximate)  I have reviewed the triage vital signs and the nursing notes.   HISTORY  Chief Complaint Fever and Fatigue    HPI Raymond [name] is a 63 y.o. male history of asbestos exposure, asthma,   Is been experiencing about 2 to 3 days of fatigue, now followed by fever as high as 102.  Took ibuprofen last night.  Having notable body aches.  Not really coughing.  Feeling fatigued with fever.  No pain or burning with urination.  No abdominal pain.  Eating normally.  No nausea vomiting  Does report that he seemed like he was wheezing some last night but this is resolved.  His wife felt he needed to come in to be evaluated.  He is somewhat worried that this could be COVID-19, but also acknowledges he has had very little if any exposure that he would know of except for 2 doctors visits but not known to have any direct exposure  He does not feel short of breath at present.  Just feels rundown, fatigue  Past Medical History:  Diagnosis Date   Arthritis    Asbestos exposure    Asthma    GERD (gastroesophageal reflux disease)    Hypertension    Muscle spasms of neck    Seasonal allergies     Patient Active Problem List   Diagnosis Date Noted   OA (osteoarthritis) of knee 06/11/2018   Acute medial meniscal tear 08/20/2013    Past Surgical History:  Procedure Laterality Date   CARPAL TUNNEL RELEASE Bilateral    CERVICAL FUSION     HAND SURGERY Right    trigger finger release   KNEE ARTHROSCOPY Left 08/21/2013   Procedure: LEFT ARTHROSCOPY KNEE WITH DEBRIDEMENT AND CHONDROPLASTY;  Surgeon: Loanne DrillingFrank V Aluisio, MD;  Location: [facility] ORS;  Service: Orthopedics;  Laterality: Left;   NASAL SINUS SURGERY     TOTAL KNEE ARTHROPLASTY Left 06/11/2018   Procedure: LEFT TOTAL KNEE ARTHROPLASTY;   Surgeon: Ollen GrossAluisio, Frank, MD;  Location: [facility] OR[facility];  Service: Orthopedics;  Laterality: Left;    Prior to Admission medications   Medication Sig Start Date End Date Taking? Authorizing Provider  albuterol (PROVENTIL HFA;VENTOLIN HFA) 108 (90 Base) MCG/ACT inhaler Inhale 2 puffs into the lungs every 4 (four) hours as needed for wheezing or shortness of breath.    [provider]  amLODipine (NORVASC) 10 MG tablet Take 10 mg by mouth daily.    [provider]  atorvastatin (LIPITOR) 10 MG tablet Take 10 mg by mouth at bedtime.    [provider]  azithromycin (ZITHROMAX) 250 MG tablet Take 1 tablet daily by mouth 06/30/19   Sharyn CreamerQuale, Magan Winnett, MD  budesonide (PULMICORT) 180 MCG/ACT inhaler Inhale 1 puff into the lungs 2 (two) times daily as needed (shortness of breath).    [provider]  diazepam (VALIUM) 5 MG tablet Take 5 mg by mouth every 8 (eight) hours as needed for muscle spasms.     [provider]  fluticasone (FLONASE) 50 MCG/ACT nasal spray Place 1 spray into both nostrils daily as needed for allergies or rhinitis.    [provider]  gabapentin (NEURONTIN) 300 MG capsule Take 1 capsule (300 mg total) by mouth every 8 (eight) hours. Gabapentin 300 mg Protocol Take a 300 mg capsule three times a day for two weeks following surgery.  Then take a 300 mg capsule two times a day for two weeks.  Then take a 300 mg capsule once a day for two weeks.  Then discontinue the Gabapentin. 06/13/18   Edmisten, Kristie L, PA  lisinopril-hydrochlorothiazide (PRINZIDE,ZESTORETIC) 20-25 MG tablet Take 1 tablet by mouth daily.    [provider]  methocarbamol (ROBAXIN) 500 MG tablet Take 1 tablet (500 mg total) by mouth every 6 (six) hours as needed for muscle spasms. 06/13/18   Edmisten, Kristie L, PA  montelukast (SINGULAIR) 10 MG tablet Take 10 mg by mouth at bedtime.      [provider]  omeprazole (PRILOSEC) 40 MG capsule Take 40 mg by mouth  daily.    [provider]  ondansetron (ZOFRAN ODT) 4 MG disintegrating tablet Take 1 tablet (4 mg total) by mouth every 6 (six) hours as needed for nausea or vomiting. 06/30/19   Delman Kitten, MD  oxyCODONE (OXY IR/ROXICODONE) 5 MG immediate release tablet Take 1-2 tablets (5-10 mg total) by mouth every 6 (six) hours as needed for moderate pain (pain score 4-6). 06/13/18   Edmisten, Kristie L, PA  ranitidine (ZANTAC) 150 MG tablet Take 150 mg by mouth at bedtime.    [provider]  zolpidem (AMBIEN) 10 MG tablet Take 10 mg by mouth at bedtime as needed for sleep.    [provider]    Allergies Irbesartan  No family history on file.  Social History Social History   Tobacco Use   Smoking status: Former Smoker    Packs/day: 2.00    Years: 15.00    Pack years: 30.00    Quit date: 08/19/1989    Years since quitting: 29.8   Smokeless tobacco: Never Used  Substance Use Topics   Alcohol use: No   Drug use: No    Review of Systems Constitutional: See HPI Eyes: No visual changes. ENT: No sore throat. Cardiovascular: Denies chest pain. Respiratory: See HPI Gastrointestinal: No abdominal pain.   Genitourinary: Negative for dysuria. Musculoskeletal: Negative for back pain.  Muscle aches from all over though. Skin: Negative for rash. Neurological: Negative for headaches, areas of focal weakness or numbness. Denies recent travel.  Denies tick bites.  No rashes.    ____________________________________________   PHYSICAL EXAM:  VITAL SIGNS: ED Triage Vitals  Enc Vitals Group     BP 06/30/19 0225 (!) 131/59     Pulse Rate 06/30/19 0225 (!) 108     Resp 06/30/19 0645 (!) 23     Temp 06/30/19 0225 98.6 F (37 C)     Temp Source 06/30/19 0225 Oral     SpO2 06/30/19 0225 94 %     Weight 06/30/19 0225 270 lb (122.5 kg)     Height 06/30/19 0225 6\' 2"  (1.88 m)     Head Circumference --      Peak Flow --      Pain Score --      Pain Loc --       Pain Edu? --      Excl. in [place]? --     Constitutional: Alert and oriented.  Mildly ill-appearing.  No acute distress, but does appear somewhat slightly diaphoretic.  Warm to the touch.  Appears generally ill but in no focal distress. Eyes: Conjunctivae are normal. Head: Atraumatic. Nose: No congestion/rhinnorhea. Mouth/Throat: Mucous membranes are moist. Neck: No stridor.  Cardiovascular: Normal rate, regular rhythm. Grossly normal heart sounds.  Good peripheral circulation. Respiratory: Slightly increased respiratory rate.  Speaks  clearly, seems just slightly dyspneic when he speaks long sentences.  There is no accessory muscle use.  Lung sounds are clear.  He is not coughing. Gastrointestinal: Soft and nontender. No distention. Musculoskeletal: No lower extremity tenderness nor edema. Neurologic:  Normal speech and language. No gross focal neurologic deficits are appreciated.  Skin:  Skin is warm, slightly diaphoretic and intact. No rash noted. Psychiatric: Mood and affect are normal. Speech and behavior are normal.  ____________________________________________   LABS (all labs ordered are listed, but only abnormal results are displayed)  Labs Reviewed  URINALYSIS, ROUTINE W REFLEX MICROSCOPIC - Abnormal; Notable for the following components:      Result Value   Color, Urine YELLOW (*)    APPearance CLEAR (*)    Glucose, UA 50 (*)    All other components within normal limits  COMPREHENSIVE METABOLIC PANEL - Abnormal; Notable for the following components:   Potassium 3.4 (*)    Glucose, Bld 179 (*)    BUN 24 (*)    Calcium 8.8 (*)    All other components within normal limits  CBC WITH DIFFERENTIAL/PLATELET - Abnormal; Notable for the following components:   WBC 16.8 (*)    Neutro Abs 12.3 (*)    Monocytes Absolute 2.0 (*)    Abs Immature Granulocytes 0.10 (*)    All other components within normal limits  SARS CORONAVIRUS 2 (TAT 6-24 HRS)  CULTURE, BLOOD (ROUTINE X 2)    CULTURE, BLOOD (ROUTINE X 2)  LACTIC ACID, PLASMA  LACTIC ACID, PLASMA  TROPONIN I (HIGH SENSITIVITY)  TROPONIN I (HIGH SENSITIVITY)   ____________________________________________  EKG  Reviewed and read by me at 640 Heart rate 99 QRS 140 QTc 470 Normal sinus rhythm, left bundle branch block, no evidence of acute ischemia ____________________________________________  RADIOLOGY  Dg Chest Port 1 View  Result Date: 06/30/2019 CLINICAL DATA:  Short of breath EXAM: PORTABLE CHEST 1 VIEW COMPARISON:  CT chest 06/06/2019 FINDINGS: Heart size and vascularity normal. Mild atelectasis in the bases. No effusion or mass. No acute skeletal abnormality.  ACDF cervical spine. IMPRESSION: Mild bibasilar atelectasis. Electronically Signed   By: Marlan Palau M.D.   On: 06/30/2019 07:32     Imaging reviewed negative for acute ____________________________________________   PROCEDURES  Procedure(s) performed: None  Procedures  Critical Care performed: No  ____________________________________________   INITIAL IMPRESSION / ASSESSMENT AND PLAN / ED COURSE  Pertinent labs & imaging results that were available during my care of the patient were reviewed by me and considered in my medical decision making (see chart for details).   Differential diagnosis certainly includes infectious etiologies, does not demonstrate acute evidence of reactive airway disease or COPD/asthma flare at this time, does report he had wheezing last night which is improved.  I think his potential risk for having COVID-19 is elevated, will treat with azithromycin and Decadron here, plan to hydrate provide antifibrotic.  Will obtain blood cultures.  Discussed goals of care with the patient, overall his strong goal of care is to be able to be home unless absolutely needed to be in the hospital which I think is reasonable.  Right now his oxygen level and work of breathing acceptable he is normotensive and just slightly  tachycardic.  Plan to hydrate.  Further work-up, will have a Covid test and blood cultures pending    ----------------------------------------- 9:47 AM on 06/30/2019 -----------------------------------------  Patient is feeling improved.  Work of breathing is normal.  Oxygen saturation normal on room air.  He appears improved as well.  Discussed with the patient very careful return precautions, including if he starts to develop shortness of breath or worsening symptoms come back to the ER.  He understands he has a coronavirus test pending that should be back in about 24 hours.  He is going to self isolate.  Return precautions and treatment recommendations and follow-up discussed with the patient who is agreeable with the plan.  I suspect viral illness, possibly COVID-19, but given the patient's history of pulmonary disease as well and we will treat him with azithromycin in the event of underlying infection such as a pulmonary source could potentially be present driving fever.  Blood cultures are pending.  ____________________________________________   FINAL CLINICAL IMPRESSION(S) / ED DIAGNOSES  Final diagnoses:  Fever, unspecified fever cause  Viral illness  Encounter for laboratory testing for COVID-19 virus        Note:  This document was prepared using Dragon voice recognition software and may include unintentional dictation errors       Sharyn Creamer, MD 06/30/19 [phone]

## 2019-06-30 NOTE — ED Notes (Signed)
Radiology called for CXR; delay d/t possible COVID s/sx and DG will be changed to 1 view when pt in room

## 2019-06-30 NOTE — ED Triage Notes (Signed)
Patient reports fever (103) at home.  Patient reports feeling like bad flu, and states wife told him he was wheezing while he was sleeping.

## 2019-06-30 NOTE — Discharge Instructions (Signed)
You have been seen in the Emergency Department (ED) today for a likely viral illness.  Please drink plenty of clear fluids (water, Gatorade, chicken broth, etc).  You may use Tylenol and/or Motrin according to label instructions.  You can alternate between the two without any side effects.   Please follow up with your doctor as listed above.  Call your doctor or return to the Emergency Department (ED) if you are unable to tolerate fluids due to vomiting, become short of breath, have worsening trouble breathing, become extremely tired or difficult to awaken, or if you develop any other symptoms that concern you.

## 2019-07-05 LAB — CULTURE, BLOOD (ROUTINE X 2)
Culture: NO GROWTH
Culture: NO GROWTH

## 2019-10-24 ENCOUNTER — Other Ambulatory Visit: Payer: Self-pay | Admitting: Pulmonary Disease

## 2019-10-24 DIAGNOSIS — J4540 Moderate persistent asthma, uncomplicated: Secondary | ICD-10-CM

## 2019-12-06 ENCOUNTER — Ambulatory Visit
Admission: RE | Admit: 2019-12-06 | Discharge: 2019-12-06 | Disposition: A | Discharge status: Home / Self Care | Payer: BC Managed Care – PPO | Source: Ambulatory Visit | Attending: Pulmonary Disease | Admitting: Pulmonary Disease

## 2019-12-06 ENCOUNTER — Other Ambulatory Visit: Payer: Self-pay

## 2019-12-06 DIAGNOSIS — J4540 Moderate persistent asthma, uncomplicated: Secondary | ICD-10-CM | POA: Diagnosis not present

## 2020-05-09 IMAGING — DX DG CHEST 1V PORT
1 series · 1 of 1 positions shown · non-contrast
Comparison: CT chest 06/06/2019

CLINICAL DATA: Short of breath

EXAM:
PORTABLE CHEST 1 VIEW

[chest ap]
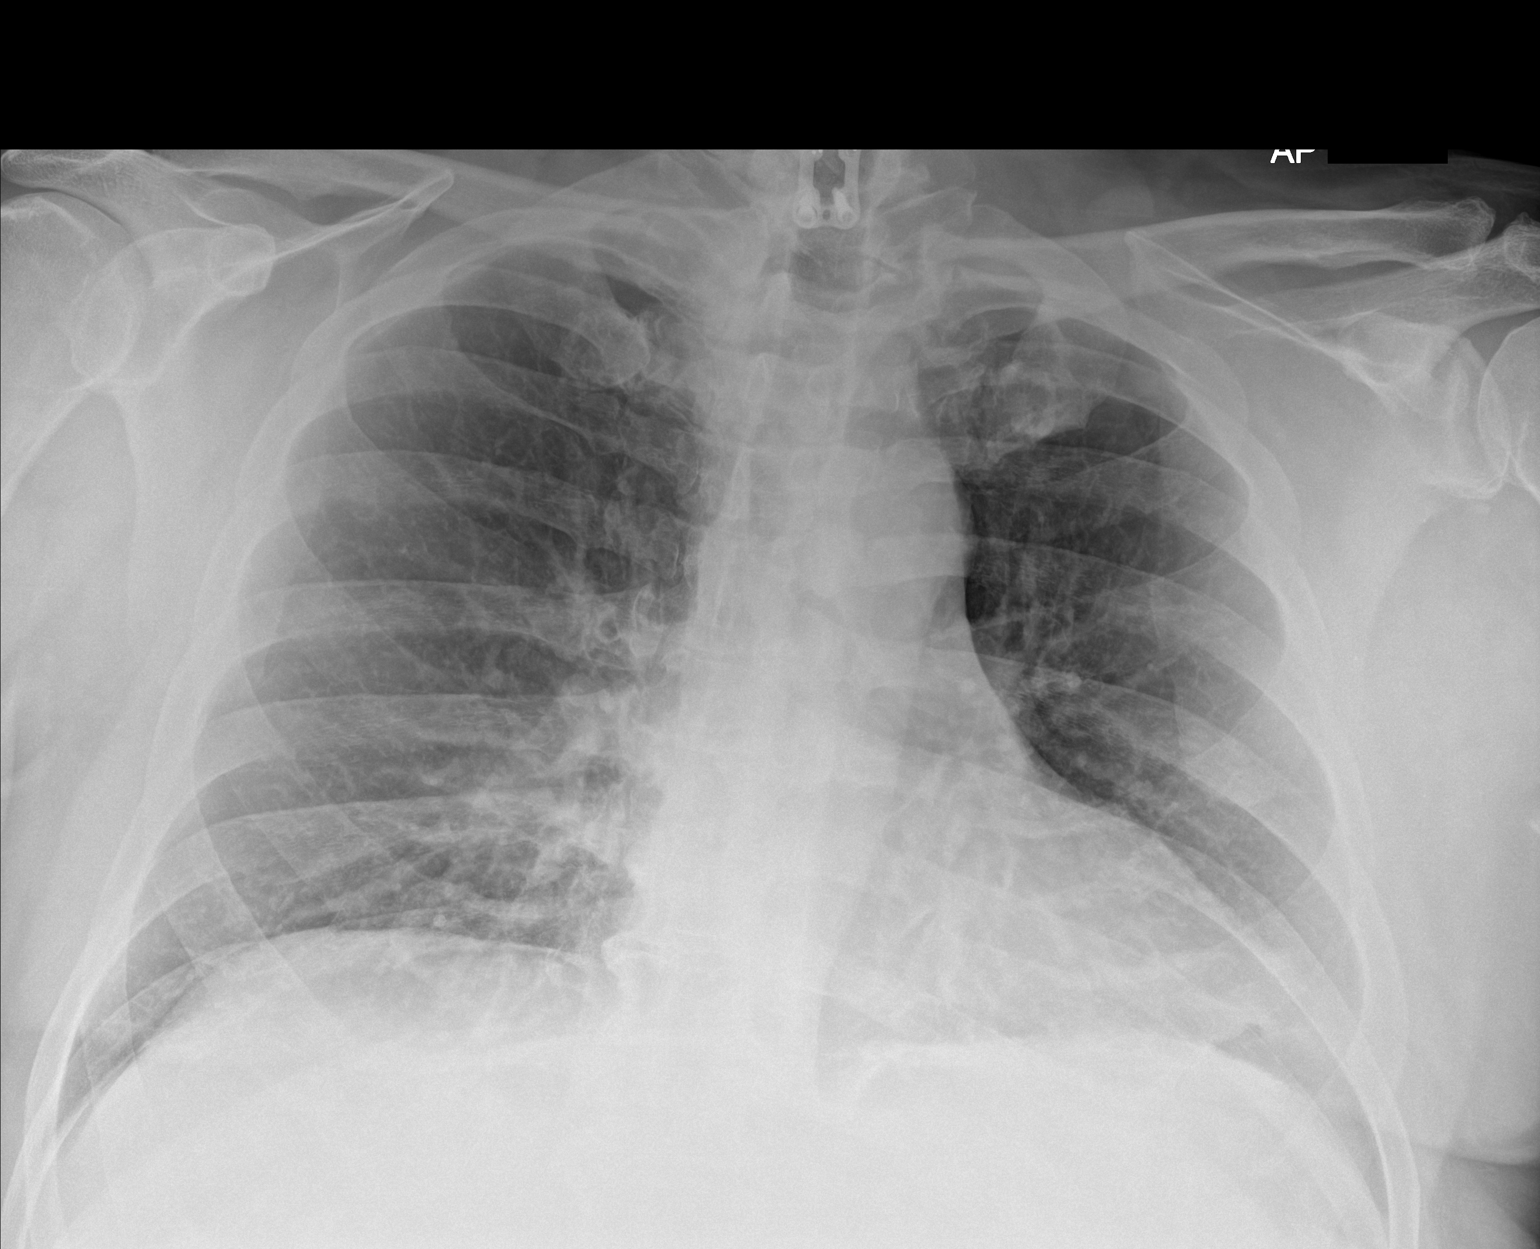

[1 of 1 positions shown; findings below may reference images not displayed]

FINDINGS: Heart size and vascularity normal. Mild atelectasis in the bases. No
effusion or mass.

No acute skeletal abnormality.  ACDF cervical spine.
IMPRESSION: Mild bibasilar atelectasis.

## 2020-06-03 NOTE — Progress Notes (Signed)
DUE TO COVID-19 ONLY ONE VISITOR IS ALLOWED TO COME WITH YOU AND STAY IN THE WAITING ROOM ONLY DURING PRE OP AND PROCEDURE DAY OF SURGERY. THE 1 VISITOR  MAY VISIT WITH YOU AFTER SURGERY IN YOUR PRIVATE ROOM DURING VISITING HOURS ONLY!  YOU NEED TO HAVE A COVID 19 TEST ON__10/05/2020 _____ @_______ , THIS TEST MUST BE DONE BEFORE SURGERY,  COVID TESTING SITE [address] [place] , IT IS ON THE RIGHT GOING OUT [address] APPROXIMATELY  2 MINUTES PAST [employer] ON THE RIGHT. ONCE YOUR COVID TEST IS COMPLETED,  PLEASE BEGIN THE QUARANTINE INSTRUCTIONS AS OUTLINED IN YOUR HANDOUT.                Raymond [name]  06/03/2020   Your procedure is scheduled on: 06/17/2020    Report to [facility] Main  Entrance   Report to admitting at    0600 AM     Call this number if you have problems the morning of surgery [phone]    REMEMBER: NO  SOLID FOOD CANDY OR GUM AFTER MIDNIGHT. CLEAR LIQUIDS UNTIL053o am          . NOTHING BY MOUTH EXCEPT CLEAR LIQUIDS UNTIL    . PLEASE FINISH ENSURE DRINK PER SURGEON ORDER  WHICH NEEDS TO BE COMPLETED AT0530am      .      CLEAR LIQUID DIET   Foods Allowed                                                                    Coffee and tea, regular and decaf                            Fruit ices (not with fruit pulp)                                      Iced Popsicles                                    Carbonated beverages, regular and diet                                    Cranberry, grape and apple juices Sports drinks like Gatorade Lightly seasoned clear broth or consume(fat free) Sugar, honey syrup ___________________________________________________________________      BRUSH YOUR TEETH MORNING OF SURGERY AND RINSE YOUR MOUTH OUT, NO CHEWING GUM CANDY OR MINTS.     Take these medicines the morning of surgery with A SIP OF WATER:      Prilosec, amlodipine, inhalers as usual and bring  DO NOT TAKE ANY  DIABETIC MEDICATIONS DAY OF YOUR SURGERY                               You may not have any metal on your body including hair pins and  piercings  Do not wear jewelry, make-up, lotions, powders or perfumes, deodorant             Do not wear nail polish on your fingernails.  Do not shave  48 hours prior to surgery.              Men may shave face and neck.   Do not bring valuables to the hospital. [place].  Contacts, dentures or bridgework may not be worn into surgery.  Leave suitcase in the car. After surgery it may be brought to your room.     Patients discharged the day of surgery will not be allowed to drive home. IF YOU ARE HAVING SURGERY AND GOING HOME THE SAME DAY, YOU MUST HAVE AN ADULT TO DRIVE YOU HOME AND BE WITH YOU FOR 24 HOURS. YOU MAY GO HOME BY TAXI OR UBER OR ORTHERWISE, BUT AN ADULT MUST ACCOMPANY YOU HOME AND STAY WITH YOU FOR 24 HOURS.  Name and phone number of your driver:  Special Instructions: N/A              Please read over the following fact sheets you were given: _____________________________________________________________________  [facility] - Preparing for Surgery Before surgery, you can play an important role.  Because skin is not sterile, your skin needs to be as free of germs as possible.  You can reduce the number of germs on your skin by washing with CHG (chlorahexidine gluconate) soap before surgery.  CHG is an antiseptic cleaner which kills germs and bonds with the skin to continue killing germs even after washing. Please DO NOT use if you have an allergy to CHG or antibacterial soaps.  If your skin becomes reddened/irritated stop using the CHG and inform your nurse when you arrive at Short Stay. Do not shave (including legs and underarms) for at least 48 hours prior to the first CHG shower.  You may shave your face/neck. Please follow these instructions carefully:  1.  Shower with CHG Soap  the night before surgery and the  morning of Surgery.  2.  If you choose to wash your hair, wash your hair first as usual with your  normal  shampoo.  3.  After you shampoo, rinse your hair and body thoroughly to remove the  shampoo.                           4.  Use CHG as you would any other liquid soap.  You can apply chg directly  to the skin and wash                       Gently with a scrungie or clean washcloth.  5.  Apply the CHG Soap to your body ONLY FROM THE NECK DOWN.   Do not use on face/ open                           Wound or open sores. Avoid contact with eyes, ears mouth and genitals (private parts).                       Wash face,  Genitals (private parts) with your normal soap.             6.  Wash thoroughly, paying special attention to the area where your surgery  will be performed.  7.  Thoroughly rinse your body with warm water from the neck down.  8.  DO NOT shower/wash with your normal soap after using and rinsing off  the CHG Soap.                9.  Pat yourself dry with a clean towel.            10.  Wear clean pajamas.            11.  Place clean sheets on your bed the night of your first shower and do not  sleep with pets. Day of Surgery : Do not apply any lotions/deodorants the morning of surgery.  Please wear clean clothes to the hospital/surgery center.  FAILURE TO FOLLOW THESE INSTRUCTIONS MAY RESULT IN THE CANCELLATION OF YOUR SURGERY PATIENT SIGNATURE_________________________________  NURSE SIGNATURE__________________________________  ________________________________________________________________________

## 2020-06-04 ENCOUNTER — Encounter (HOSPITAL_COMMUNITY): Payer: Self-pay

## 2020-06-04 ENCOUNTER — Other Ambulatory Visit: Payer: Self-pay

## 2020-06-04 ENCOUNTER — Encounter (HOSPITAL_COMMUNITY)
Admission: RE | Admit: 2020-06-04 | Discharge: 2020-06-04 | Disposition: A | Discharge status: Home / Self Care | Payer: BC Managed Care – PPO | Source: Ambulatory Visit | Attending: Orthopedic Surgery | Admitting: Orthopedic Surgery

## 2020-06-04 DIAGNOSIS — Z01812 Encounter for preprocedural laboratory examination: Secondary | ICD-10-CM | POA: Insufficient documentation

## 2020-06-04 HISTORY — DX: Sleep apnea, unspecified: G47.30

## 2020-06-04 HISTORY — DX: Type 2 diabetes mellitus without complications: E11.9

## 2020-06-04 LAB — CBC
HCT: 44.3 % (ref 39.0–52.0)
Hemoglobin: 14.5 g/dL (ref 13.0–17.0)
MCH: 29.7 pg (ref 26.0–34.0)
MCHC: 32.7 g/dL (ref 30.0–36.0)
MCV: 90.8 fL (ref 80.0–100.0)
Platelets: 328 10*3/uL (ref 150–400)
RBC: 4.88 MIL/uL (ref 4.22–5.81)
RDW: 14 % (ref 11.5–15.5)
WBC: 8.9 10*3/uL (ref 4.0–10.5)
nRBC: 0 % (ref 0.0–0.2)

## 2020-06-04 LAB — COMPREHENSIVE METABOLIC PANEL
ALT: 29 U/L (ref 0–44)
AST: 26 U/L (ref 15–41)
Albumin: 4.4 g/dL (ref 3.5–5.0)
Alkaline Phosphatase: 49 U/L (ref 38–126)
Anion gap: 11 (ref 5–15)
BUN: 20 mg/dL (ref 8–23)
CO2: 24 mmol/L (ref 22–32)
Calcium: 9.2 mg/dL (ref 8.9–10.3)
Chloride: 101 mmol/L (ref 98–111)
Creatinine, Ser: 0.88 mg/dL (ref 0.61–1.24)
GFR calc Af Amer: >60 mL/min (ref >60)
GFR calc non Af Amer: >60 mL/min (ref >60)
Glucose, Bld: 118 mg/dL — ABNORMAL HIGH (ref 70–99)
Potassium: 4.5 mmol/L (ref 3.5–5.1)
Sodium: 136 mmol/L (ref 135–145)
Total Bilirubin: 0.8 mg/dL (ref 0.3–1.2)
Total Protein: 7.5 g/dL (ref 6.5–8.1)

## 2020-06-04 LAB — APTT: aPTT: 26 seconds (ref 24–36)

## 2020-06-04 LAB — PROTIME-INR
INR: 0.9 (ref 0.8–1.2)
Prothrombin Time: 11.4 seconds (ref 11.4–15.2)

## 2020-06-04 LAB — SURGICAL PCR SCREEN
MRSA, PCR: NEGATIVE
Staphylococcus aureus: NEGATIVE

## 2020-06-04 LAB — TYPE AND SCREEN
ABO/RH(D): A POS
Antibody Screen: NEGATIVE

## 2020-06-04 NOTE — Progress Notes (Signed)
Requested 12 leak ekg tracing by fax from [facility] Internal medicine.   HGBA1c- results - done 05/21/20- [facility]- 6.5.Marland Kitchen

## 2020-06-05 NOTE — Progress Notes (Addendum)
Anesthesia Review:  PCP: DR Marcelino Duster [facility] Internal Medicine [phone] Clearance dated 05/28/20 from DR Letitia Libra on chart  Cardiologist : Chest x-ray : EKG : requested on 06/05/20 from [facility]  Echo : Stress test: Cardiac Cath :  Activity level:  Sleep Study/ CPAP : Fasting Blood Sugar :      / Checks Blood Sugar -- times a day:   Blood Thinner/ Instructions /Last Dose: ASA / Instructions/ Last Dose : \ HGBA1c- result- requested from [facility] on 06/05/20

## 2020-06-10 NOTE — Progress Notes (Signed)
EKG 05/28/20 received and placed in chart  Hgb A1c 6.5 on 05/21/20 lab results placed in chart

## 2020-06-13 ENCOUNTER — Other Ambulatory Visit (HOSPITAL_COMMUNITY)
Admission: RE | Admit: 2020-06-13 | Discharge: 2020-06-13 | Disposition: A | Discharge status: Home / Self Care | Payer: BC Managed Care – PPO | Source: Ambulatory Visit | Attending: Orthopedic Surgery | Admitting: Orthopedic Surgery

## 2020-06-13 DIAGNOSIS — Z20822 Contact with and (suspected) exposure to covid-19: Secondary | ICD-10-CM | POA: Insufficient documentation

## 2020-06-13 DIAGNOSIS — Z01812 Encounter for preprocedural laboratory examination: Secondary | ICD-10-CM | POA: Diagnosis present

## 2020-06-13 LAB — SARS CORONAVIRUS 2 (TAT 6-24 HRS): SARS Coronavirus 2: NEGATIVE

## 2020-06-15 NOTE — H&P (Signed)
ADMISSION H&P  Patient is being admitted for left total knee polyethylene revision  Subjective:  Chief Complaint: Left knee pain.  HPI: Raymond [name], 64 y.o. male presents for pre-operative visit in preparation for their left total knee arthroplasty polyethylene revision, which is scheduled on 06/17/2020 with Dr. Lequita Halt at [facility]. The patient has had symptoms in the left knee including pain and instability which has impacted their quality of life and ability to do activities of daily living. The patient currently has a diagnosis of unstable left total knee arthroplasty and has failed conservative treatments including activity modification and use of assistive devices. The patient has had previous total knee arthroplasty on the left knee. The patient denies an active infection.  Patient Active Problem List   Diagnosis Date Noted  . OA (osteoarthritis) of knee 06/11/2018  . Acute medial meniscal tear 08/20/2013    Past Medical History:  Diagnosis Date  . Arthritis   . Asbestos exposure   . Asthma   . Diabetes mellitus without complication (HCC)    type 2   . GERD (gastroesophageal reflux disease)   . Hypertension   . Muscle spasms of neck   . Seasonal allergies   . Sleep apnea    cpap- does not know settings     Past Surgical History:  Procedure Laterality Date  . CARPAL TUNNEL RELEASE Bilateral   . CERVICAL FUSION    . HAND SURGERY Right    trigger finger release  . KNEE ARTHROSCOPY Left 08/21/2013   Procedure: LEFT ARTHROSCOPY KNEE WITH DEBRIDEMENT AND CHONDROPLASTY;  Surgeon: Loanne Drilling, MD;  Location: [facility];  Service: Orthopedics;  Laterality: Left;  . NASAL SINUS SURGERY    . TOTAL KNEE ARTHROPLASTY Left 06/11/2018   Procedure: LEFT TOTAL KNEE ARTHROPLASTY;  Surgeon: Ollen Gross, MD;  Location: [facility];  Service: Orthopedics;  Laterality: Left;    Prior to Admission medications   Medication Sig Start Date End Date Taking? Authorizing Provider    albuterol (PROVENTIL HFA;VENTOLIN HFA) 108 (90 Base) MCG/ACT inhaler Inhale 2 puffs into the lungs every 4 (four) hours as needed for wheezing or shortness of breath.   Yes [provider]  amLODipine (NORVASC) 10 MG tablet Take 10 mg by mouth daily.   Yes [provider]  atorvastatin (LIPITOR) 10 MG tablet Take 10 mg by mouth at bedtime.   Yes [provider]  HYDROcodone-acetaminophen (NORCO) 10-325 MG tablet Take 1 tablet by mouth every 4 (four) hours as needed for pain. 04/27/20  Yes [provider]  hydrocortisone valerate cream (WESTCORT) 0.2 % Apply 1 application topically 2 (two) times daily as needed (skin irritation/rash.).   Yes [provider]  metFORMIN (GLUCOPHAGE) 500 MG tablet Take 500 mg by mouth 2 (two) times daily. 05/20/20  Yes [provider]  montelukast (SINGULAIR) 10 MG tablet Take 10 mg by mouth at bedtime.     Yes [provider]  omeprazole (PRILOSEC) 40 MG capsule Take 40 mg by mouth daily before breakfast.    Yes [provider]  Polyethyl Glycol-Propyl Glycol (SYSTANE) 0.4-0.3 % SOLN Place 1 drop into both eyes 3 (three) times daily as needed (dry/irritated eyes.).   Yes [provider]  TRELEGY ELLIPTA 100-62.5-25 MCG/INH AEPB Inhale 1 puff into the lungs daily as needed for wheezing. 05/18/20  Yes [provider]  zolpidem (AMBIEN) 10 MG tablet Take 10 mg by mouth at bedtime as needed for sleep.   Yes [provider]  ipratropium (ATROVENT) 0.06 % nasal spray Place 2 sprays into both nostrils 3 (three) times daily as needed for allergies. 02/11/20   [provider]    Allergies  Allergen Reactions  . Irbesartan Other (See Comments)    Sun sensitivity/skin burning    Social History   Socioeconomic History  . Marital status: Married    Spouse name: Not on file  . Number of children: Not on file  . Years of education: 49  . Highest education level: Not on  file  Occupational History  . Not on file  Tobacco Use  . Smoking status: Former Smoker    Packs/day: 2.00    Years: 15.00    Pack years: 30.00    Quit date: 08/19/1989    Years since quitting: 30.8  . Smokeless tobacco: Never Used  Vaping Use  . Vaping Use: Never used  Substance and Sexual Activity  . Alcohol use: No  . Drug use: No  . Sexual activity: Not on file  Other Topics Concern  . Not on file  Social History Narrative  . Not on file   Social Determinants of Health   Financial Resource Strain:   . Difficulty of Paying Living Expenses: Not on file  Food Insecurity:   . Worried About Programme researcher, broadcasting/film/video in the Last Year: Not on file  . Ran Out of Food in the Last Year: Not on file  Transportation Needs:   . Lack of Transportation (Medical): Not on file  . Lack of Transportation (Non-Medical): Not on file  Physical Activity:   . Days of Exercise per Week: Not on file  . Minutes of Exercise per Session: Not on file  Stress:   . Feeling of Stress : Not on file  Social Connections:   . Frequency of Communication with Friends and Family: Not on file  . Frequency of Social Gatherings with Friends and Family: Not on file  . Attends Religious Services: Not on file  . Active Member of Clubs or Organizations: Not on file  . Attends Banker Meetings: Not on file  . Marital Status: Not on file  Intimate Partner Violence:   . Fear of Current or Ex-Partner: Not on file  . Emotionally Abused: Not on file  . Physically Abused: Not on file  . Sexually Abused: Not on file      Tobacco Use: Medium Risk  . Smoking Tobacco Use: Former Smoker  . Smokeless Tobacco Use: Never Used   Social History   Substance and Sexual Activity  Alcohol Use No    No family history on file.  Review of Systems  Constitutional: Negative for chills and fever.  HENT: Negative for congestion, sore throat and tinnitus.   Eyes: Negative for double vision, photophobia and pain.    Respiratory: Negative for cough, shortness of breath and wheezing.   Cardiovascular: Negative for chest pain, palpitations and orthopnea.  Gastrointestinal: Negative for heartburn, nausea and vomiting.  Genitourinary: Negative for dysuria, frequency and urgency.  Musculoskeletal: Positive for joint pain.  Neurological: Negative for dizziness, weakness and headaches.    Objective:  Physical Exam: Well nourished and well developed. General: Alert and oriented x3, cooperative and pleasant, no acute distress. Head: normocephalic, atraumatic, neck supple. Eyes: EOMI. Respiratory: breath sounds clear in all fields, no wheezing, rales, or rhonchi. Cardiovascular: Regular rate and rhythm, no murmurs, gallops or rubs.  Abdomen: non-tender to palpation and soft, normoactive bowel sounds. Musculoskeletal:  Left Knee Exam:  No effusion present. No swelling present.  The range of motion is: 0 to 125 degrees.  No crepitus on range of motion of the knee.  No medial joint line tenderness.  No lateral joint line tenderness.  Tiny bit of varus-valgus laxity in 30 degrees of flexion, but has a fairly significant amount of AP laxity in 90 degrees of flexion.   Calves soft and nontender. Motor function intact in LE. Strength 5/5 LE bilaterally. Neuro: Distal pulses 2+. Sensation to light touch intact in LE.  Imaging Review Plain radiographs demonstrate the prosthesis in good position with no periprosthetic abnormalities.   Assessment/Plan:  Unstable left total knee arthroplasty  The patient history, physical examination, clinical judgment of the provider and imaging studies are consistent with unstable left TKA and polyethylene revision is deemed medically necessary. The treatment options including conservative measures, bracing, and physical therapy were discussed at length. The patient acknowledged the explanation, agreed to proceed with the plan and consent was signed. Patient is being admitted  for inpatient treatment for surgery, pain control, PT, OT, prophylactic antibiotics, VTE prophylaxis, progressive ambulation and ADLs and discharge planning. The patient is planning to be discharged home.  Therapy Plans: Outpatient therapy at [facility]) Disposition: Home with wife Planned DVT Prophylaxis: Aspirin 325 mg BID DME Needed: None PCP: Marcelino Duster, MD (appt on 9/23) TXA: IV Allergies: Irbesartan Anesthesia Concerns: None BMI: 35 Last HgbA1c: Unsure. Will order with preoperative labs.  Pharmacy: [facility], [place] ([address])  Other:  - Norco 10-325 mg Q6 - Pertinent PMH: COPD, DM  - Patient was instructed on what medications to stop prior to surgery. - Follow-up visit in 2 weeks with Dr. Lequita Halt - Begin physical therapy following surgery - Pre-operative lab work as pre-surgical testing - Prescriptions will be provided in hospital at time of discharge  Arther Abbott, PA-C Orthopedic Surgery [facility] [place]

## 2020-06-16 MED ORDER — BUPIVACAINE LIPOSOME 1.3 % IJ SUSP
20.0000 mL | Freq: Once | INTRAMUSCULAR | Status: DC
  Filled 2020-06-16: qty 20

## 2020-06-16 NOTE — Anesthesia Preprocedure Evaluation (Addendum)
Anesthesia Evaluation  Patient identified by MRN, date of birth, ID band Patient awake    Reviewed: Allergy & Precautions, NPO status , Patient's Chart, lab work & pertinent test results  History of Anesthesia Complications Negative for: history of anesthetic complications  Airway Mallampati: III  TM Distance: >3 FB Neck ROM: Full    Dental  (+) Dental Advisory Given, Implants   Pulmonary asthma , former smoker,    Pulmonary exam normal        Cardiovascular hypertension, Pt. on medications Normal cardiovascular exam     Neuro/Psych negative neurological ROS  negative psych ROS   GI/Hepatic Neg liver ROS, GERD  Medicated and Controlled,  Endo/Other  diabetes, Type 2, Oral Hypoglycemic Agents Obesity   Renal/GU negative Renal ROS     Musculoskeletal  (+) Arthritis ,   Abdominal   Peds  Hematology negative hematology ROS (+)  Plt 328k    Anesthesia Other Findings Covid test negative   Reproductive/Obstetrics                            Anesthesia Physical Anesthesia Plan  ASA: II  Anesthesia Plan: Spinal   Post-op Pain Management:  Regional for Post-op pain   Induction:   PONV Risk Score and Plan: 1 and Treatment may vary due to age or medical condition and Propofol infusion  Airway Management Planned: Natural Airway and Simple Face Mask  Additional Equipment: None  Intra-op Plan:   Post-operative Plan:   Informed Consent: I have reviewed the patients History and Physical, chart, labs and discussed the procedure including the risks, benefits and alternatives for the proposed anesthesia with the patient or authorized representative who has indicated his/her understanding and acceptance.       Plan Discussed with: CRNA and Anesthesiologist  Anesthesia Plan Comments: (Labs reviewed, platelets acceptable. Discussed risks and benefits of spinal, including spinal/epidural  hematoma, infection, failed block, and PDPH. Patient expressed understanding and wished to proceed. )       Anesthesia Quick Evaluation

## 2020-06-17 ENCOUNTER — Encounter (HOSPITAL_COMMUNITY)
Admission: RE | Disposition: A | Discharge status: Home / Self Care | Payer: Self-pay | Source: Home / Self Care | Attending: Orthopedic Surgery

## 2020-06-17 ENCOUNTER — Inpatient Hospital Stay (HOSPITAL_COMMUNITY)
Admission: RE | Admit: 2020-06-17 | Discharge: 2020-06-18 | DRG: 489 | Disposition: A | Discharge status: Home / Self Care | Payer: BC Managed Care – PPO | Attending: Orthopedic Surgery | Admitting: Orthopedic Surgery

## 2020-06-17 ENCOUNTER — Inpatient Hospital Stay (HOSPITAL_COMMUNITY): Payer: BC Managed Care – PPO | Admitting: Physician Assistant

## 2020-06-17 ENCOUNTER — Inpatient Hospital Stay (HOSPITAL_COMMUNITY): Payer: BC Managed Care – PPO | Admitting: Anesthesiology

## 2020-06-17 ENCOUNTER — Encounter (HOSPITAL_COMMUNITY): Payer: Self-pay | Admitting: Orthopedic Surgery

## 2020-06-17 ENCOUNTER — Other Ambulatory Visit: Payer: Self-pay

## 2020-06-17 DIAGNOSIS — Z981 Arthrodesis status: Secondary | ICD-10-CM | POA: Diagnosis not present

## 2020-06-17 DIAGNOSIS — Z96659 Presence of unspecified artificial knee joint: Secondary | ICD-10-CM

## 2020-06-17 DIAGNOSIS — J449 Chronic obstructive pulmonary disease, unspecified: Secondary | ICD-10-CM | POA: Diagnosis present

## 2020-06-17 DIAGNOSIS — T84023A Instability of internal left knee prosthesis, initial encounter: Secondary | ICD-10-CM | POA: Diagnosis present

## 2020-06-17 DIAGNOSIS — Z7984 Long term (current) use of oral hypoglycemic drugs: Secondary | ICD-10-CM

## 2020-06-17 DIAGNOSIS — I10 Essential (primary) hypertension: Secondary | ICD-10-CM | POA: Diagnosis present

## 2020-06-17 DIAGNOSIS — T84018A Broken internal joint prosthesis, other site, initial encounter: Secondary | ICD-10-CM

## 2020-06-17 DIAGNOSIS — G4730 Sleep apnea, unspecified: Secondary | ICD-10-CM | POA: Diagnosis present

## 2020-06-17 DIAGNOSIS — T84093A Other mechanical complication of internal left knee prosthesis, initial encounter: Secondary | ICD-10-CM

## 2020-06-17 DIAGNOSIS — Z87891 Personal history of nicotine dependence: Secondary | ICD-10-CM | POA: Diagnosis not present

## 2020-06-17 DIAGNOSIS — Z888 Allergy status to other drugs, medicaments and biological substances status: Secondary | ICD-10-CM

## 2020-06-17 DIAGNOSIS — K219 Gastro-esophageal reflux disease without esophagitis: Secondary | ICD-10-CM | POA: Diagnosis present

## 2020-06-17 DIAGNOSIS — Y792 Prosthetic and other implants, materials and accessory orthopedic devices associated with adverse incidents: Secondary | ICD-10-CM | POA: Diagnosis present

## 2020-06-17 DIAGNOSIS — E119 Type 2 diabetes mellitus without complications: Secondary | ICD-10-CM | POA: Diagnosis present

## 2020-06-17 DIAGNOSIS — Z79899 Other long term (current) drug therapy: Secondary | ICD-10-CM

## 2020-06-17 HISTORY — PX: TOTAL KNEE REVISION: SHX996

## 2020-06-17 LAB — GLUCOSE, CAPILLARY
Glucose-Capillary: 127 mg/dL — ABNORMAL HIGH (ref 70–99)
Glucose-Capillary: 137 mg/dL — ABNORMAL HIGH (ref 70–99)
Glucose-Capillary: 136 mg/dL — ABNORMAL HIGH (ref 70–99)
Glucose-Capillary: 198 mg/dL — ABNORMAL HIGH (ref 70–99)
Glucose-Capillary: 140 mg/dL — ABNORMAL HIGH (ref 70–99)

## 2020-06-17 SURGERY — TOTAL KNEE REVISION
Anesthesia: Spinal | Site: Knee | Laterality: Left

## 2020-06-17 MED ORDER — UMECLIDINIUM BROMIDE 62.5 MCG/INH IN AEPB
1.0000 | INHALATION_SPRAY | Freq: Every day | RESPIRATORY_TRACT | Status: DC
  Filled 2020-06-17: qty 7

## 2020-06-17 MED ORDER — DEXAMETHASONE SODIUM PHOSPHATE 10 MG/ML IJ SOLN
8.0000 mg | Freq: Once | INTRAMUSCULAR | Status: AC
  Administered 2020-06-17: 8 mg via INTRAVENOUS

## 2020-06-17 MED ORDER — OXYCODONE HCL 5 MG/5ML PO SOLN: 5.0000 mg | Freq: Once | ORAL | Status: DC | PRN

## 2020-06-17 MED ORDER — ONDANSETRON HCL 4 MG PO TABS: 4.0000 mg | ORAL_TABLET | Freq: Four times a day (QID) | ORAL | Status: DC | PRN

## 2020-06-17 MED ORDER — ACETAMINOPHEN 10 MG/ML IV SOLN
1000.0000 mg | Freq: Once | INTRAVENOUS | Status: DC
  Filled 2020-06-17: qty 100

## 2020-06-17 MED ORDER — PHENOL 1.4 % MT LIQD: 1.0000 | OROMUCOSAL | Status: DC | PRN

## 2020-06-17 MED ORDER — CEFAZOLIN SODIUM-DEXTROSE 2-4 GM/100ML-% IV SOLN
2.0000 g | INTRAVENOUS | Status: AC
  Administered 2020-06-17: 2 g via INTRAVENOUS
  Filled 2020-06-17: qty 100

## 2020-06-17 MED ORDER — MIDAZOLAM HCL 5 MG/5ML IJ SOLN
INTRAMUSCULAR | Status: DC | PRN
  Administered 2020-06-17: 1 mg via INTRAVENOUS
  Administered 2020-06-17 (×2): .5 mg via INTRAVENOUS

## 2020-06-17 MED ORDER — SODIUM CHLORIDE 0.9 % IV SOLN: INTRAVENOUS | Status: DC

## 2020-06-17 MED ORDER — SODIUM CHLORIDE (PF) 0.9 % IJ SOLN
INTRAMUSCULAR | Status: AC
  Filled 2020-06-17: qty 10

## 2020-06-17 MED ORDER — DEXAMETHASONE SODIUM PHOSPHATE 10 MG/ML IJ SOLN
INTRAMUSCULAR | Status: AC
  Filled 2020-06-17: qty 1

## 2020-06-17 MED ORDER — METOCLOPRAMIDE HCL 5 MG PO TABS: 5.0000 mg | ORAL_TABLET | Freq: Three times a day (TID) | ORAL | Status: DC | PRN

## 2020-06-17 MED ORDER — OXYCODONE HCL 5 MG PO TABS: 5.0000 mg | ORAL_TABLET | Freq: Once | ORAL | Status: DC | PRN

## 2020-06-17 MED ORDER — PHENYLEPHRINE 40 MCG/ML (10ML) SYRINGE FOR IV PUSH (FOR BLOOD PRESSURE SUPPORT)
PREFILLED_SYRINGE | INTRAVENOUS | Status: AC
  Filled 2020-06-17: qty 10

## 2020-06-17 MED ORDER — DOCUSATE SODIUM 100 MG PO CAPS
100.0000 mg | ORAL_CAPSULE | Freq: Two times a day (BID) | ORAL | Status: DC
  Administered 2020-06-17 – 2020-06-18 (×2): 100 mg via ORAL
  Filled 2020-06-17 (×2): qty 1

## 2020-06-17 MED ORDER — OXYCODONE HCL 5 MG PO TABS
5.0000 mg | ORAL_TABLET | ORAL | Status: DC | PRN
  Administered 2020-06-17 (×2): 5 mg via ORAL
  Administered 2020-06-17 – 2020-06-18 (×4): 10 mg via ORAL
  Filled 2020-06-17 (×3): qty 2
  Filled 2020-06-17 (×2): qty 1
  Filled 2020-06-17: qty 2

## 2020-06-17 MED ORDER — CEFAZOLIN SODIUM-DEXTROSE 2-4 GM/100ML-% IV SOLN
2.0000 g | Freq: Four times a day (QID) | INTRAVENOUS | Status: AC
  Administered 2020-06-17 (×2): 2 g via INTRAVENOUS
  Filled 2020-06-17 (×2): qty 100

## 2020-06-17 MED ORDER — ASPIRIN EC 325 MG PO TBEC
325.0000 mg | DELAYED_RELEASE_TABLET | Freq: Two times a day (BID) | ORAL | Status: DC
  Administered 2020-06-18: 325 mg via ORAL
  Filled 2020-06-17: qty 1

## 2020-06-17 MED ORDER — MIDAZOLAM HCL 2 MG/2ML IJ SOLN
INTRAMUSCULAR | Status: AC
  Filled 2020-06-17: qty 2

## 2020-06-17 MED ORDER — ACETAMINOPHEN 325 MG PO TABS: 325.0000 mg | ORAL_TABLET | Freq: Four times a day (QID) | ORAL | Status: DC | PRN

## 2020-06-17 MED ORDER — FENTANYL CITRATE (PF) 100 MCG/2ML IJ SOLN: 25.0000 ug | INTRAMUSCULAR | Status: DC | PRN

## 2020-06-17 MED ORDER — POLYETHYLENE GLYCOL 3350 17 G PO PACK
17.0000 g | PACK | Freq: Every day | ORAL | Status: DC | PRN
  Administered 2020-06-17: 17 g via ORAL
  Filled 2020-06-17: qty 1

## 2020-06-17 MED ORDER — BISACODYL 10 MG RE SUPP: 10.0000 mg | Freq: Every day | RECTAL | Status: DC | PRN

## 2020-06-17 MED ORDER — SODIUM CHLORIDE 0.9 % IR SOLN
Status: DC | PRN
  Administered 2020-06-17: 1000 mL

## 2020-06-17 MED ORDER — 0.9 % SODIUM CHLORIDE (POUR BTL) OPTIME
TOPICAL | Status: DC | PRN
  Administered 2020-06-17: 1000 mL

## 2020-06-17 MED ORDER — PROPOFOL 500 MG/50ML IV EMUL
INTRAVENOUS | Status: DC | PRN
  Administered 2020-06-17: 50 ug/kg/min via INTRAVENOUS

## 2020-06-17 MED ORDER — FLUTICASONE FUROATE-VILANTEROL 100-25 MCG/INH IN AEPB: 1.0000 | INHALATION_SPRAY | Freq: Every day | RESPIRATORY_TRACT | Status: DC

## 2020-06-17 MED ORDER — INSULIN ASPART 100 UNIT/ML ~~LOC~~ SOLN: 0.0000 [IU] | Freq: Every day | SUBCUTANEOUS | Status: DC

## 2020-06-17 MED ORDER — LACTATED RINGERS IV SOLN: INTRAVENOUS | Status: DC

## 2020-06-17 MED ORDER — SODIUM CHLORIDE (PF) 0.9 % IJ SOLN
INTRAMUSCULAR | Status: DC | PRN
  Administered 2020-06-17: 60 mL

## 2020-06-17 MED ORDER — PHENYLEPHRINE HCL (PRESSORS) 10 MG/ML IV SOLN
INTRAVENOUS | Status: DC | PRN
  Administered 2020-06-17: 40 ug via INTRAVENOUS
  Administered 2020-06-17: 80 ug via INTRAVENOUS

## 2020-06-17 MED ORDER — AMLODIPINE BESYLATE 10 MG PO TABS
10.0000 mg | ORAL_TABLET | Freq: Every day | ORAL | Status: DC
  Administered 2020-06-18: 10 mg via ORAL
  Filled 2020-06-17: qty 1

## 2020-06-17 MED ORDER — CHLORHEXIDINE GLUCONATE 0.12 % MT SOLN
15.0000 mL | Freq: Once | OROMUCOSAL | Status: AC
  Administered 2020-06-17: 15 mL via OROMUCOSAL

## 2020-06-17 MED ORDER — BUPIVACAINE IN DEXTROSE 0.75-8.25 % IT SOLN
INTRATHECAL | Status: DC | PRN
  Administered 2020-06-17: 1.6 mL via INTRATHECAL

## 2020-06-17 MED ORDER — PANTOPRAZOLE SODIUM 40 MG PO TBEC
80.0000 mg | DELAYED_RELEASE_TABLET | Freq: Every day | ORAL | Status: DC
  Administered 2020-06-18: 80 mg via ORAL
  Filled 2020-06-17: qty 2

## 2020-06-17 MED ORDER — PROPOFOL 500 MG/50ML IV EMUL
INTRAVENOUS | Status: AC
  Filled 2020-06-17: qty 50

## 2020-06-17 MED ORDER — POVIDONE-IODINE 10 % EX SWAB
2.0000 "application " | Freq: Once | CUTANEOUS | Status: AC
  Administered 2020-06-17: 2 via TOPICAL

## 2020-06-17 MED ORDER — ONDANSETRON HCL 4 MG/2ML IJ SOLN: 4.0000 mg | Freq: Four times a day (QID) | INTRAMUSCULAR | Status: DC | PRN

## 2020-06-17 MED ORDER — ZOLPIDEM TARTRATE 10 MG PO TABS
5.0000 mg | ORAL_TABLET | Freq: Every evening | ORAL | Status: DC | PRN
  Administered 2020-06-18: 5 mg via ORAL
  Filled 2020-06-17: qty 1

## 2020-06-17 MED ORDER — ATORVASTATIN CALCIUM 10 MG PO TABS
10.0000 mg | ORAL_TABLET | Freq: Every day | ORAL | Status: DC
  Administered 2020-06-17: 10 mg via ORAL
  Filled 2020-06-17: qty 1

## 2020-06-17 MED ORDER — FLUTICASONE FUROATE-VILANTEROL 100-25 MCG/INH IN AEPB
1.0000 | INHALATION_SPRAY | Freq: Every day | RESPIRATORY_TRACT | Status: DC
  Filled 2020-06-17: qty 28

## 2020-06-17 MED ORDER — ORAL CARE MOUTH RINSE: 15.0000 mL | Freq: Once | OROMUCOSAL | Status: AC

## 2020-06-17 MED ORDER — BUPIVACAINE LIPOSOME 1.3 % IJ SUSP
INTRAMUSCULAR | Status: DC | PRN
  Administered 2020-06-17: 20 mL

## 2020-06-17 MED ORDER — POLYVINYL ALCOHOL 1.4 % OP SOLN
1.0000 [drp] | Freq: Three times a day (TID) | OPHTHALMIC | Status: DC | PRN
  Filled 2020-06-17: qty 15

## 2020-06-17 MED ORDER — KETAMINE HCL 10 MG/ML IJ SOLN
INTRAMUSCULAR | Status: AC
  Filled 2020-06-17: qty 1

## 2020-06-17 MED ORDER — METHOCARBAMOL 500 MG IVPB - SIMPLE MED
500.0000 mg | Freq: Four times a day (QID) | INTRAVENOUS | Status: DC | PRN
  Filled 2020-06-17: qty 50

## 2020-06-17 MED ORDER — MORPHINE SULFATE (PF) 2 MG/ML IV SOLN: 0.5000 mg | INTRAVENOUS | Status: DC | PRN

## 2020-06-17 MED ORDER — INSULIN ASPART 100 UNIT/ML ~~LOC~~ SOLN
0.0000 [IU] | Freq: Three times a day (TID) | SUBCUTANEOUS | Status: DC
  Administered 2020-06-17: 2 [IU] via SUBCUTANEOUS
  Administered 2020-06-17: 3 [IU] via SUBCUTANEOUS
  Administered 2020-06-18: 2 [IU] via SUBCUTANEOUS

## 2020-06-17 MED ORDER — PROPOFOL 10 MG/ML IV BOLUS
INTRAVENOUS | Status: AC
  Filled 2020-06-17: qty 20

## 2020-06-17 MED ORDER — STERILE WATER FOR IRRIGATION IR SOLN
Status: DC | PRN
  Administered 2020-06-17: 2000 mL

## 2020-06-17 MED ORDER — MONTELUKAST SODIUM 10 MG PO TABS
10.0000 mg | ORAL_TABLET | Freq: Every day | ORAL | Status: DC
  Administered 2020-06-17: 10 mg via ORAL
  Filled 2020-06-17: qty 1

## 2020-06-17 MED ORDER — FENTANYL CITRATE (PF) 100 MCG/2ML IJ SOLN
INTRAMUSCULAR | Status: AC
  Filled 2020-06-17: qty 2

## 2020-06-17 MED ORDER — MENTHOL 3 MG MT LOZG: 1.0000 | LOZENGE | OROMUCOSAL | Status: DC | PRN

## 2020-06-17 MED ORDER — TRAMADOL HCL 50 MG PO TABS
50.0000 mg | ORAL_TABLET | Freq: Four times a day (QID) | ORAL | Status: DC | PRN
  Administered 2020-06-17: 100 mg via ORAL
  Filled 2020-06-17: qty 2

## 2020-06-17 MED ORDER — FLEET ENEMA 7-19 GM/118ML RE ENEM: 1.0000 | ENEMA | Freq: Once | RECTAL | Status: DC | PRN

## 2020-06-17 MED ORDER — FLUTICASONE-UMECLIDIN-VILANT 100-62.5-25 MCG/INH IN AEPB: 1.0000 | INHALATION_SPRAY | Freq: Every day | RESPIRATORY_TRACT | Status: DC | PRN

## 2020-06-17 MED ORDER — FENTANYL CITRATE (PF) 100 MCG/2ML IJ SOLN
INTRAMUSCULAR | Status: DC | PRN
Start: 2020-06-17 — End: 2020-06-17
  Administered 2020-06-17 (×2): 50 ug via INTRAVENOUS

## 2020-06-17 MED ORDER — METOCLOPRAMIDE HCL 5 MG/ML IJ SOLN: 5.0000 mg | Freq: Three times a day (TID) | INTRAMUSCULAR | Status: DC | PRN

## 2020-06-17 MED ORDER — TRANEXAMIC ACID-NACL 1000-0.7 MG/100ML-% IV SOLN
1000.0000 mg | INTRAVENOUS | Status: AC
  Administered 2020-06-17: 1000 mg via INTRAVENOUS
  Filled 2020-06-17: qty 100

## 2020-06-17 MED ORDER — PROMETHAZINE HCL 25 MG/ML IJ SOLN: 6.2500 mg | INTRAMUSCULAR | Status: DC | PRN

## 2020-06-17 MED ORDER — KETAMINE HCL 10 MG/ML IJ SOLN
INTRAMUSCULAR | Status: DC | PRN
  Administered 2020-06-17: 35 mg via INTRAVENOUS

## 2020-06-17 MED ORDER — ONDANSETRON HCL 4 MG/2ML IJ SOLN
INTRAMUSCULAR | Status: DC | PRN
  Administered 2020-06-17: 4 mg via INTRAVENOUS

## 2020-06-17 MED ORDER — PROPOFOL 10 MG/ML IV BOLUS
INTRAVENOUS | Status: DC | PRN
  Administered 2020-06-17 (×3): 10 mg via INTRAVENOUS

## 2020-06-17 MED ORDER — LIDOCAINE 2% (20 MG/ML) 5 ML SYRINGE
INTRAMUSCULAR | Status: AC
  Filled 2020-06-17: qty 5

## 2020-06-17 MED ORDER — ACETAMINOPHEN 10 MG/ML IV SOLN
INTRAVENOUS | Status: DC | PRN
  Administered 2020-06-17: 1000 mg via INTRAVENOUS

## 2020-06-17 MED ORDER — LIDOCAINE HCL (CARDIAC) PF 100 MG/5ML IV SOSY
PREFILLED_SYRINGE | INTRAVENOUS | Status: DC | PRN
  Administered 2020-06-17: 50 mg via INTRAVENOUS

## 2020-06-17 MED ORDER — METHOCARBAMOL 500 MG PO TABS
500.0000 mg | ORAL_TABLET | Freq: Four times a day (QID) | ORAL | Status: DC | PRN
  Administered 2020-06-17 – 2020-06-18 (×3): 500 mg via ORAL
  Filled 2020-06-17 (×3): qty 1

## 2020-06-17 MED ORDER — DIPHENHYDRAMINE HCL 12.5 MG/5ML PO ELIX
12.5000 mg | ORAL_SOLUTION | ORAL | Status: DC | PRN
  Administered 2020-06-17: 25 mg via ORAL
  Filled 2020-06-17: qty 10

## 2020-06-17 MED ORDER — IPRATROPIUM BROMIDE 0.06 % NA SOLN
2.0000 | Freq: Three times a day (TID) | NASAL | Status: DC | PRN
  Filled 2020-06-17: qty 15

## 2020-06-17 MED ORDER — ROPIVACAINE HCL 7.5 MG/ML IJ SOLN
INTRAMUSCULAR | Status: DC | PRN
  Administered 2020-06-17: 20 mL via PERINEURAL

## 2020-06-17 MED ORDER — ALBUTEROL SULFATE HFA 108 (90 BASE) MCG/ACT IN AERS: 2.0000 | INHALATION_SPRAY | RESPIRATORY_TRACT | Status: DC | PRN

## 2020-06-17 MED ORDER — SODIUM CHLORIDE (PF) 0.9 % IJ SOLN
INTRAMUSCULAR | Status: AC
  Filled 2020-06-17: qty 50

## 2020-06-17 MED ORDER — UMECLIDINIUM BROMIDE 62.5 MCG/INH IN AEPB: 1.0000 | INHALATION_SPRAY | Freq: Every day | RESPIRATORY_TRACT | Status: DC

## 2020-06-17 SURGICAL SUPPLY — 56 items
ATTUNE PSRP INSR SZ8 10 KNEE (Insert) ×2 IMPLANT
ATTUNE PSRP INSR SZ8 10MMKNEE (Insert) ×1 IMPLANT
BAG DECANTER FOR FLEXI CONT (MISCELLANEOUS) ×3 IMPLANT
BAG ZIPLOCK 12X15 (MISCELLANEOUS) IMPLANT
BLADE SAG 18X100X1.27 (BLADE) ×3 IMPLANT
BLADE SAW SGTL 11.0X1.19X90.0M (BLADE) ×3 IMPLANT
BLADE SURG SZ10 CARB STEEL (BLADE) ×6 IMPLANT
BNDG ELASTIC 6X5.8 VLCR STR LF (GAUZE/BANDAGES/DRESSINGS) ×3 IMPLANT
CLOSURE WOUND 1/2 X4 (GAUZE/BANDAGES/DRESSINGS) ×1
CLOTH BEACON ORANGE TIMEOUT ST (SAFETY) ×3 IMPLANT
COVER SURGICAL LIGHT HANDLE (MISCELLANEOUS) ×3 IMPLANT
COVER WAND RF STERILE (DRAPES) IMPLANT
CUFF TOURN SGL QUICK 34 (TOURNIQUET CUFF) ×2
CUFF TRNQT CYL 34X4.125X (TOURNIQUET CUFF) ×1 IMPLANT
DECANTER SPIKE VIAL GLASS SM (MISCELLANEOUS) IMPLANT
DRAPE U-SHAPE 47X51 STRL (DRAPES) ×3 IMPLANT
DRSG ADAPTIC 3X8 NADH LF (GAUZE/BANDAGES/DRESSINGS) ×3 IMPLANT
DRSG AQUACEL AG ADV 3.5X10 (GAUZE/BANDAGES/DRESSINGS) ×3 IMPLANT
DRSG PAD ABDOMINAL 8X10 ST (GAUZE/BANDAGES/DRESSINGS) ×3 IMPLANT
DURAPREP 26ML APPLICATOR (WOUND CARE) ×3 IMPLANT
ELECT REM PT RETURN 15FT ADLT (MISCELLANEOUS) ×3 IMPLANT
EVACUATOR 1/8 PVC DRAIN (DRAIN) ×3 IMPLANT
GAUZE SPONGE 4X4 12PLY STRL (GAUZE/BANDAGES/DRESSINGS) ×3 IMPLANT
GLOVE BIO SURGEON STRL SZ7 (GLOVE) ×3 IMPLANT
GLOVE BIO SURGEON STRL SZ8 (GLOVE) ×3 IMPLANT
GLOVE BIOGEL PI IND STRL 7.0 (GLOVE) ×1 IMPLANT
GLOVE BIOGEL PI IND STRL 8 (GLOVE) ×1 IMPLANT
GLOVE BIOGEL PI INDICATOR 7.0 (GLOVE) ×2
GLOVE BIOGEL PI INDICATOR 8 (GLOVE) ×2
GOWN STRL REUS W/TWL LRG LVL3 (GOWN DISPOSABLE) ×6 IMPLANT
HANDPIECE INTERPULSE COAX TIP (DISPOSABLE) ×2
HOLDER FOLEY CATH W/STRAP (MISCELLANEOUS) IMPLANT
IMMOBILIZER KNEE 20 (SOFTGOODS) ×3
IMMOBILIZER KNEE 20 THIGH 36 (SOFTGOODS) ×1 IMPLANT
KIT TURNOVER KIT A (KITS) IMPLANT
MANIFOLD NEPTUNE II (INSTRUMENTS) ×3 IMPLANT
NS IRRIG 1000ML POUR BTL (IV SOLUTION) ×3 IMPLANT
PACK TOTAL KNEE CUSTOM (KITS) ×3 IMPLANT
PADDING CAST COTTON 6X4 STRL (CAST SUPPLIES) ×3 IMPLANT
PENCIL SMOKE EVACUATOR (MISCELLANEOUS) IMPLANT
PROTECTOR NERVE ULNAR (MISCELLANEOUS) ×3 IMPLANT
SET HNDPC FAN SPRY TIP SCT (DISPOSABLE) ×1 IMPLANT
STRIP CLOSURE SKIN 1/2X4 (GAUZE/BANDAGES/DRESSINGS) ×2 IMPLANT
SUT MNCRL AB 4-0 PS2 18 (SUTURE) ×3 IMPLANT
SUT STRATAFIX 0 PDS 27 VIOLET (SUTURE) ×3
SUT VIC AB 2-0 CT1 27 (SUTURE) ×6
SUT VIC AB 2-0 CT1 TAPERPNT 27 (SUTURE) ×3 IMPLANT
SUTURE STRATFX 0 PDS 27 VIOLET (SUTURE) ×1 IMPLANT
SWAB COLLECTION DEVICE MRSA (MISCELLANEOUS) IMPLANT
SWAB CULTURE ESWAB REG 1ML (MISCELLANEOUS) IMPLANT
SYR 50ML LL SCALE MARK (SYRINGE) ×6 IMPLANT
TOWER CARTRIDGE SMART MIX (DISPOSABLE) ×3 IMPLANT
TRAY FOLEY MTR SLVR 16FR STAT (SET/KITS/TRAYS/PACK) ×3 IMPLANT
TUBE KAMVAC SUCTION (TUBING) IMPLANT
WATER STERILE IRR 1000ML POUR (IV SOLUTION) ×3 IMPLANT
WRAP KNEE MAXI GEL POST OP (GAUZE/BANDAGES/DRESSINGS) ×3 IMPLANT

## 2020-06-17 NOTE — Transfer of Care (Signed)
Immediate Anesthesia Transfer of Care Note  Patient: Raymond [name]  Procedure(s) Performed: Left total knee arthroplasty poly revision (Left Knee)  Patient Location: PACU  Anesthesia Type:Spinal  Level of Consciousness: awake, alert , oriented and patient cooperative  Airway & Oxygen Therapy: Patient Spontanous Breathing and Patient connected to face mask oxygen  Post-op Assessment: Report given to RN and Post -op Vital signs reviewed and stable  Post vital signs: Reviewed and stable  Last Vitals:  Vitals Value Taken Time  BP 108/75 06/17/20 0956  Temp    Pulse 87 06/17/20 0958  Resp 12 06/17/20 0958  SpO2 98 % 06/17/20 0958  Vitals shown include unvalidated device data.  Last Pain:  Vitals:   06/17/20 0652  TempSrc: Oral  PainSc:       Patients Stated Pain Goal: 4 (06/17/20 6283)  Complications: No complications documented.

## 2020-06-17 NOTE — Brief Op Note (Signed)
06/17/2020  9:21 AM  PATIENT:  Raymond [name]  64 y.o. male  PRE-OPERATIVE DIAGNOSIS:  Left total knee arthroplasty instability  POST-OPERATIVE DIAGNOSIS:  Left total knee arthroplasty instability  PROCEDURE:  Procedure(s) with comments: Left total knee arthroplasty poly revision (Left) -  SURGEON:  Surgeon(s) and Role:    Ollen Gross, MD - Primary  PHYSICIAN ASSISTANT:   ASSISTANTS: Dennie Bible, PA-C   ANESTHESIA:   Adductor canal block and spinal  EBL: 25 ml  BLOOD ADMINISTERED:none  DRAINS: none   LOCAL MEDICATIONS USED:  OTHER Exparel  COUNTS:  YES  TOURNIQUET:   Total Tourniquet Time Documented: Thigh (Left) - 28 minutes Total: Thigh (Left) - 28 minutes   DICTATION: .Other Dictation: Dictation Number [id number]  PLAN OF CARE: Admit for overnight observation  PATIENT DISPOSITION:  PACU - hemodynamically stable.

## 2020-06-17 NOTE — Progress Notes (Signed)
Orthopedic Tech Progress Note Patient Details:  Raymond [name] 01-Dec-1955 [id number]  CPM Left Knee CPM Left Knee: On Left Knee Flexion (Degrees): 40 Left Knee Extension (Degrees): 10 Additional Comments: left  Post Interventions Patient Tolerated: Well  Saul Fordyce 06/17/2020, 10:03 AM

## 2020-06-17 NOTE — Progress Notes (Signed)
Orthopedic Tech Progress Note Patient Details:  Raymond [name] 07-Oct-1955 [id number] Patient already has knee immobilizer. Patient ID: [name], male   DOB: 03/03/1956, 64 y.o.   MRN: [id number]   Jennye Moccasin 06/17/2020, 12:09 PM

## 2020-06-17 NOTE — Interval H&P Note (Signed)
History and Physical Interval Note:  06/17/2020 7:42 AM  Raymond [name]  has presented today for surgery, with the diagnosis of Left total knee arthroplasty instability.  The various methods of treatment have been discussed with the patient and family. After consideration of risks, benefits and other options for treatment, the patient has consented to  Procedure(s) with comments: Left total knee arthroplasty poly revision (Left) - as a surgical intervention.  The patient's history has been reviewed, patient examined, no change in status, stable for surgery.  I have reviewed the patient's chart and labs.  Questions were answered to the patient's satisfaction.     Homero Fellers Vianka Ertel

## 2020-06-17 NOTE — Discharge Instructions (Addendum)
 Raymond Aluisio, MD Total Joint Specialist [facility] [address] [place], [place] [address] [phone] POSTOPERATIVE DIRECTIONS  Knee Rehabilitation, Guidelines Following Surgery  Results after knee surgery are often greatly improved when you follow the exercise, range of motion and muscle strengthening exercises prescribed by your doctor. Safety measures are also important to protect the knee from further injury. If any of these exercises cause you to have increased pain or swelling in your knee joint, decrease the amount until you are comfortable again and slowly increase them. If you have problems or questions, call your caregiver or physical therapist for advice.   BLOOD CLOT PREVENTION . Take a 325 mg Aspirin two times a day for three weeks following surgery. Then take an 81 mg Aspirin once a day for three weeks. Then discontinue Aspirin. . You may resume your vitamins/supplements upon discharge from the hospital. . Do not take any NSAIDs (Advil, Aleve, Ibuprofen, Meloxicam, etc.) until you have discontinued the 325 mg Aspirin.  HOME CARE INSTRUCTIONS  . Remove items at home which could result in a fall. This includes throw rugs or furniture in walking pathways.  . ICE to the affected knee as much as tolerated. Icing helps control swelling. If the swelling is well controlled you will be more comfortable and rehab easier. Continue to use ice on the knee for pain and swelling from surgery. You may notice swelling that will progress down to the foot and ankle. This is normal after surgery. Elevate the leg when you are not up walking on it.    . Continue to use the breathing machine which will help keep your temperature down. It is common for your temperature to cycle up and down following surgery, especially at night when you are not up moving around and exerting yourself. The breathing machine keeps your lungs expanded and your temperature down. . Do not  place pillow under the operative knee, focus on keeping the knee straight while resting  DIET You may resume your previous home diet once you are discharged from the hospital.  DRESSING / WOUND CARE / SHOWERING . Keep your bulky bandage on for 2 days. On the third post-operative day you may remove the Ace bandage and gauze. There is a waterproof adhesive bandage on your skin which will stay in place until your first follow-up appointment. Once you remove this you will not need to place another bandage . You may begin showering 3 days following surgery, but do not submerge the incision under water.  ACTIVITY For the first 5 days, the key is rest and control of pain and swelling . Do your home exercises twice a day starting on post-operative day 3. On the days you go to physical therapy, just do the home exercises once that day. . You should rest, ice and elevate the leg for 50 minutes out of every hour. Get up and walk/stretch for 10 minutes per hour. After 5 days you can increase your activity slowly as tolerated. . Walk with your walker as instructed. Use the walker until you are comfortable transitioning to a cane. Walk with the cane in the opposite hand of the operative leg. You may discontinue the cane once you are comfortable and walking steadily. . Avoid periods of inactivity such as sitting longer than an hour when not asleep. This helps prevent blood clots.  . You may discontinue the knee immobilizer once you are able to perform a straight leg raise while lying down. .   You may resume a sexual relationship in one month or when given the OK by your doctor.  . You may return to work once you are cleared by your doctor.  . Do not drive a car for 6 weeks or until released by your surgeon.  . Do not drive while taking narcotics.  TED HOSE STOCKINGS Wear the elastic stockings on both legs for three weeks following surgery during the day. You may remove them at night for sleeping.  WEIGHT  BEARING Weight bearing as tolerated with assist device (walker, cane, etc) as directed, use it as long as suggested by your surgeon or therapist, typically at least 4-6 weeks.  POSTOPERATIVE CONSTIPATION PROTOCOL Constipation - defined medically as fewer than three stools per week and severe constipation as less than one stool per week.  One of the most common issues patients have following surgery is constipation.  Even if you have a regular bowel pattern at home, your normal regimen is likely to be disrupted due to multiple reasons following surgery.  Combination of anesthesia, postoperative narcotics, change in appetite and fluid intake all can affect your bowels.  In order to avoid complications following surgery, here are some recommendations in order to help you during your recovery period.  . Colace (docusate) - Pick up an over-the-counter form of Colace or another stool softener and take twice a day as long as you are requiring postoperative pain medications.  Take with a full glass of water daily.  If you experience loose stools or diarrhea, hold the colace until you stool forms back up. If your symptoms do not get better within 1 week or if they get worse, check with your doctor. . Dulcolax (bisacodyl) - Pick up over-the-counter and take as directed by the product packaging as needed to assist with the movement of your bowels.  Take with a full glass of water.  Use this product as needed if not relieved by Colace only.  . MiraLax (polyethylene glycol) - Pick up over-the-counter to have on hand. MiraLax is a solution that will increase the amount of water in your bowels to assist with bowel movements.  Take as directed and can mix with a glass of water, juice, soda, coffee, or tea. Take if you go more than two days without a movement. Do not use MiraLax more than once per day. Call your doctor if you are still constipated or irregular after using this medication for 7 days in a row.  If you  continue to have problems with postoperative constipation, please contact the office for further assistance and recommendations.  If you experience "the worst abdominal pain ever" or develop nausea or vomiting, please contact the office immediatly for further recommendations for treatment.  ITCHING If you experience itching with your medications, try taking only a single pain pill, or even half a pain pill at a time.  You can also use Benadryl over the counter for itching or also to help with sleep.   MEDICATIONS See your medication summary on the "After Visit Summary" that the nursing staff will review with you prior to discharge.  You may have some home medications which will be placed on hold until you complete the course of blood thinner medication.  It is important for you to complete the blood thinner medication as prescribed by your surgeon.  Continue your approved medications as instructed at time of discharge.  PRECAUTIONS . If you experience chest pain or shortness of breath - call 911 immediately for   transfer to the hospital emergency department.  . If you develop a fever greater that 101 F, purulent drainage from wound, increased redness or drainage from wound, foul odor from the wound/dressing, or calf pain - CONTACT YOUR SURGEON.                                                   FOLLOW-UP APPOINTMENTS Make sure you keep all of your appointments after your operation with your surgeon and caregivers. You should call the office at the above phone number and make an appointment for approximately two weeks after the date of your surgery or on the date instructed by your surgeon outlined in the "After Visit Summary".  RANGE OF MOTION AND STRENGTHENING EXERCISES  Rehabilitation of the knee is important following a knee injury or an operation. After just a few days of immobilization, the muscles of the thigh which control the knee become weakened and shrink (atrophy). Knee exercises are  designed to build up the tone and strength of the thigh muscles and to improve knee motion. Often times heat used for twenty to thirty minutes before working out will loosen up your tissues and help with improving the range of motion but do not use heat for the first two weeks following surgery. These exercises can be done on a training (exercise) mat, on the floor, on a table or on a bed. Use what ever works the best and is most comfortable for you Knee exercises include:  . Leg Lifts - While your knee is still immobilized in a splint or cast, you can do straight leg raises. Lift the leg to 60 degrees, hold for 3 sec, and slowly lower the leg. Repeat 10-20 times 2-3 times daily. Perform this exercise against resistance later as your knee gets better.  . Quad and Hamstring Sets - Tighten up the muscle on the front of the thigh (Quad) and hold for 5-10 sec. Repeat this 10-20 times hourly. Hamstring sets are done by pushing the foot backward against an object and holding for 5-10 sec. Repeat as with quad sets.   Leg Slides: Lying on your back, slowly slide your foot toward your buttocks, bending your knee up off the floor (only go as far as is comfortable). Then slowly slide your foot back down until your leg is flat on the floor again.  Angel Wings: Lying on your back spread your legs to the side as far apart as you can without causing discomfort.  A rehabilitation program following serious knee injuries can speed recovery and prevent re-injury in the future due to weakened muscles. Contact your doctor or a physical therapist for more information on knee rehabilitation.   IF YOU ARE TRANSFERRED TO A SKILLED REHAB FACILITY If the patient is transferred to a skilled rehab facility following release from the hospital, a list of the current medications will be sent to the facility for the patient to continue.  When discharged from the skilled rehab facility, please have the facility set up the patient's Home  Health Physical Therapy prior to being released. Also, the skilled facility will be responsible for providing the patient with their medications at time of release from the facility to include their pain medication, the muscle relaxants, and their blood thinner medication. If the patient is still at the rehab facility at time of the   two week follow up appointment, the skilled rehab facility will also need to assist the patient in arranging follow up appointment in our office and any transportation needs.  MAKE SURE YOU:  . Understand these instructions.  . Get help right away if you are not doing well or get worse.   DENTAL ANTIBIOTICS:  In most cases prophylactic antibiotics for Dental procdeures after total joint surgery are not necessary.  Exceptions are as follows:  1. History of prior total joint infection  2. Severely immunocompromised (Organ Transplant, cancer chemotherapy, Rheumatoid biologic meds such as Humera)  3. Poorly controlled diabetes (A1C &gt; 8.0, blood glucose over 200)  If you have one of these conditions, contact your surgeon for an antibiotic prescription, prior to your dental procedure.    Pick up stool softner and laxative for home use following surgery while on pain medications. Do not submerge incision under water. Please use good hand washing techniques while changing dressing each day. May shower starting three days after surgery. Please use a clean towel to pat the incision dry following showers. Continue to use ice for pain and swelling after surgery. Do not use any lotions or creams on the incision until instructed by your surgeon.  

## 2020-06-17 NOTE — Anesthesia Postprocedure Evaluation (Signed)
Anesthesia Post Note  Patient: MARQUELLE MUSGRAVE  Procedure(s) Performed: Left total knee arthroplasty poly revision (Left Knee)     Patient location during evaluation: PACU Anesthesia Type: Spinal Level of consciousness: awake and alert Pain management: pain level controlled Vital Signs Assessment: post-procedure vital signs reviewed and stable Respiratory status: spontaneous breathing and respiratory function stable Cardiovascular status: blood pressure returned to baseline and stable Postop Assessment: spinal receding and no apparent nausea or vomiting Anesthetic complications: no   No complications documented.  Last Vitals:  Vitals:   06/17/20 1120 06/17/20 1213  BP: 119/90 120/75  Pulse: 76 74  Resp: 14 18  Temp: 36.4 C 36.5 C  SpO2: 96% 97%    Last Pain:  Vitals:   06/17/20 1213  TempSrc: Oral  PainSc:                  Beryle Lathe

## 2020-06-17 NOTE — Anesthesia Procedure Notes (Signed)
Spinal  Patient location during procedure: OR Start time: 06/17/2020 8:26 AM End time: 06/17/2020 8:30 AM Staffing Performed: anesthesiologist  Anesthesiologist: Beryle Lathe, MD Preanesthetic Checklist Completed: patient identified, IV checked, risks and benefits discussed, surgical consent, monitors and equipment checked, pre-op evaluation and timeout performed Spinal Block Patient position: sitting Prep: DuraPrep Patient monitoring: heart rate, cardiac monitor, continuous pulse ox and blood pressure Approach: midline Location: L3-4 Injection technique: single-shot Needle Needle type: Pencan  Needle gauge: 24 G Additional Notes Consent was obtained prior to the procedure with all questions answered and concerns addressed. Risks including, but not limited to, bleeding, infection, nerve damage, paralysis, failed block, inadequate analgesia, allergic reaction, high spinal, itching, and headache were discussed and the patient wished to proceed. Functioning IV was confirmed and monitors were applied. Sterile prep and drape, including hand hygiene, mask, and sterile gloves were used. The patient was positioned and the spine was prepped. The skin was anesthetized with lidocaine. Free flow of clear CSF was obtained prior to injecting local anesthetic into the CSF. The spinal needle aspirated freely following injection. The needle was carefully withdrawn. The patient tolerated the procedure well.   Leslye Peer, MD

## 2020-06-17 NOTE — Evaluation (Signed)
Physical Therapy Evaluation Patient Details Name: Raymond [name] MRN: [id number] DOB: 08-Sep-1955 Today's Date: 06/17/2020   History of Present Illness  Patient is 64 y.o. male s/p Lt TKR (polyexchange) on 06/17/20 with PMH significant for HTN, GERD, DM, OA, asthma, Lt TKA in 2019.    Clinical Impression  ATTICUS WEDIN is a 64 y.o. male POD 0 s/p Lt TKR. Patient reports independence with mobility at baseline. Patient is now limited by functional impairments (see PT problem list below) and requires min assist for transfers and gait with RW. Patient was able to ambulate ~38 feet with RW and min assist. Patient instructed in exercise to facilitate ROM and circulation. Patient will benefit from continued skilled PT interventions to address impairments and progress towards PLOF. Acute PT will follow to progress mobility and stair training in preparation for safe discharge home.     Follow Up Recommendations Follow surgeon's recommendation for DC plan and follow-up therapies;Outpatient PT    Equipment Recommendations  None recommended by PT    Recommendations for Other Services       Precautions / Restrictions Precautions Precautions: Fall Restrictions Weight Bearing Restrictions: No      Mobility  Bed Mobility Overal bed mobility: Needs Assistance Bed Mobility: Supine to Sit     Supine to sit: Min guard;HOB elevated     General bed mobility comments: cues to use bed rail, no assist needed to bring LE's off EOB.  Transfers Overall transfer level: Needs assistance Equipment used: Rolling walker (2 wheeled) Transfers: Sit to/from Stand Sit to Stand: From elevated surface;Min assist         General transfer comment: Pt able to complete power up, min assist to steady in rising and standing. VC's for safe reach back to recliner.  Ambulation/Gait Ambulation/Gait assistance: Min assist Gait Distance (Feet): 38 Feet Assistive device: Rolling walker (2 wheeled) Gait  Pattern/deviations: Step-to pattern;Decreased stride length;Decreased weight shift to left;Narrow base of support Gait velocity: decr   General Gait Details: cues for safe step pattern and proximity to RW. pt wtih NBOS and cues needed to increase step width. 1 episode of Rt knee buckling (suspect due to spinal block) min assist to prevent LOB.   Stairs            Wheelchair Mobility    Modified Rankin (Stroke Patients Only)       Balance Overall balance assessment: Needs assistance Sitting-balance support: Feet supported Sitting balance-Leahy Scale: Good     Standing balance support: Bilateral upper extremity supported;During functional activity Standing balance-Leahy Scale: Poor Standing balance comment: min assist to steady                             Pertinent Vitals/Pain Pain Assessment: No/denies pain    Home Living Family/patient expects to be discharged to:: Private residence Living Arrangements: Spouse/significant other Available Help at Discharge: Family Type of Home: House Home Access: Stairs to enter Entrance Stairs-Rails: Right;Left;Can reach both Entrance Stairs-Number of Steps: 5 Home Layout: One level Home Equipment: Environmental consultant - 2 wheels;Cane - single point;Bedside commode;Shower seat - built in      Prior Function Level of Independence: Independent               Hand Dominance   Dominant Hand: Right    Extremity/Trunk Assessment   Upper Extremity Assessment Upper Extremity Assessment: Overall WFL for tasks assessed    Lower Extremity Assessment Lower Extremity Assessment: LLE deficits/detail (  slight buckling 1x on Rt LE (suspect due to spinal block)) LLE Deficits / Details: good quad activation, no extensor lag with SLR LLE Sensation: WNL LLE Coordination: WNL    Cervical / Trunk Assessment Cervical / Trunk Assessment: Normal  Communication   Communication: No difficulties  Cognition Arousal/Alertness:  Awake/alert Behavior During Therapy: WFL for tasks assessed/performed Overall Cognitive Status: Within Functional Limits for tasks assessed                                        General Comments      Exercises Total Joint Exercises Ankle Circles/Pumps: AROM;Both;15 reps;Seated Quad Sets: AROM;Left;10 reps;Seated Heel Slides: AROM;Left;10 reps;Seated   Assessment/Plan    PT Assessment Patient needs continued PT services  PT Problem List Decreased strength;Decreased range of motion;Decreased mobility;Decreased balance;Decreased activity tolerance;Decreased knowledge of use of DME;Decreased knowledge of precautions       PT Treatment Interventions DME instruction;Gait training;Stair training;Functional mobility training;Therapeutic activities;Therapeutic exercise;Balance training;Patient/family education    PT Goals (Current goals can be found in the Care Plan section)  Acute Rehab PT Goals Patient Stated Goal: get back to independence, working outside PT Goal Formulation: With patient Time For Goal Achievement: 06/24/20 Potential to Achieve Goals: Good    Frequency 7X/week   Barriers to discharge        Co-evaluation               AM-PAC PT "6 Clicks" Mobility  Outcome Measure Help needed turning from your back to your side while in a flat bed without using bedrails?: A Little Help needed moving from lying on your back to sitting on the side of a flat bed without using bedrails?: A Little Help needed moving to and from a bed to a chair (including a wheelchair)?: A Little Help needed standing up from a chair using your arms (e.g., wheelchair or bedside chair)?: A Little Help needed to walk in hospital room?: A Little Help needed climbing 3-5 steps with a railing? : A Lot 6 Click Score: 17    End of Session Equipment Utilized During Treatment: Gait belt Activity Tolerance: Patient tolerated treatment well Patient left: in chair;with call  bell/phone within reach;with chair alarm set;with family/visitor present Nurse Communication: Mobility status PT Visit Diagnosis: Muscle weakness (generalized) (M62.81);Difficulty in walking, not elsewhere classified (R26.2)    Time: 6578-4696 PT Time Calculation (min) (ACUTE ONLY): 25 min   Charges:   PT Evaluation $PT Eval Low Complexity: 1 Low PT Treatments $Gait Training: 8-22 mins        Wynn Maudlin, DPT Acute Rehabilitation Services  Office [phone] Pager [phone]  06/17/2020 12:59 PM

## 2020-06-17 NOTE — Op Note (Signed)
NAME: Raymond [name], Raymond [name] MEDICAL RECORD YK:[id number] ACCOUNT [id number] DATE OF BIRTH:Mar 30, 1956 FACILITY: [facility] LOCATION: [facility] PHYSICIAN:Kaoru Rezendes Dulcy Fanny, MD  OPERATIVE REPORT  DATE OF PROCEDURE:  06/17/2020  PREOPERATIVE DIAGNOSIS:  Unstable left total knee arthroplasty.  POSTOPERATIVE DIAGNOSIS:  Unstable left total knee arthroplasty.  PROCEDURE:  Left knee polyethylene revision.  SURGEON:  Ollen Gross, MD  ASSISTANT:  Dennie Bible, PA-C  ANESTHESIA:  Adductor canal block and spinal.  ESTIMATED BLOOD LOSS:  Minimal.  DRAIN:  None.  TOURNIQUET TIME:  28 minutes at 300 mmHg.  COMPLICATIONS:  None.  CONDITION:  Stable to recovery.  BRIEF CLINICAL NOTE:  The patient is a 64 year old male who had a left total knee arthroplasty done a few years ago.  He has developed mild instability but has had pain associated with this.  His prosthetic components appear normal and there were no  signs of infection.  He does have some instability in extension as well as flexion.  He presents now for polyethylene versus total knee revision.  PROCEDURE IN DETAIL:  After successful administration of adductor canal block and spinal, the tourniquet was placed high on the left thigh and left lower extremity prepped and draped in the usual sterile fashion.  Extremity was wrapped in Esmarch,  tourniquet inflated to 300 mmHg.  Midline incision was made with a 10 blade through subcutaneous tissue to the level of the extensor mechanism.  A fresh blade was used to make a medial parapatellar arthrotomy.  Soft tissue on the proximal medial tibia  subperiosteally elevated to the joint line with a knife and into the semimembranosus bursa with a Cobb elevator.  Soft tissue laterally was elevated with attention being paid to avoiding the patellar tendon on tibial tubercle.  I did excise scar tissue  from under the extensor mechanism both medially and laterally.  I was then able to evert the patella and  flex the knee 90 degrees.  He did have some AP laxity at 90 degrees.  I was able to remove the tibial polyethylene.  It was 7 mm thick.  He did not  have gross laxity but was enough laxity to be causing his symptoms.  We placed a 10 mm trial, which was the 10 mm posterior stabilized rotating platform insert trial for the size 8 femur.  The knee was reduced.  Full extension was achieved with excellent  varus/valgus and anterior/posterior balance throughout full range of motion.  That trial was removed and the permanent 10 mm posterior stabilized rotating platform insert was placed in tibial tray.  Prior to doing that, I placed about 20 mL of Exparel  mixed with 60 of saline into the posterior capsule, into the extensor mechanism and subcutaneous tissues.  After the polyethylene was placed, the knee was reduced with outstanding stability throughout full range of motion.  Wound was then copiously  irrigated with saline solution with pulsatile lavage.  The arthrotomy was closed with a running 0 Stratafix suture.  Flexion against gravity was 135 degrees and the patella tracks normally.  Tourniquet was released.  Total time of 28 minutes.   Subcutaneous was then closed with interrupted 2-0 Vicryl and subcuticular running 4-0 Monocryl.  Incision was cleaned and dried and Steri-Strips and a bulky sterile dressing are applied.  He was then awakened and transported to recovery in stable  condition.  HN/NUANCE  D:06/17/2020 T:06/17/2020 JOB:[id number]

## 2020-06-17 NOTE — Anesthesia Procedure Notes (Signed)
Anesthesia Regional Block: Adductor canal block   Pre-Anesthetic Checklist: ,, timeout performed, Correct Patient, Correct Site, Correct Laterality, Correct Procedure, Correct Position, site marked, Risks and benefits discussed,  Surgical consent,  Pre-op evaluation,  At surgeon's request and post-op pain management  Laterality: Left  Prep: chloraprep       Needles:  Injection technique: Single-shot  Needle Type: Echogenic Needle     Needle Length: 10cm  Needle Gauge: 21     Additional Needles:   Narrative:  Start time: 06/17/2020 8:07 AM End time: 06/17/2020 8:10 AM Injection made incrementally with aspirations every 5 mL.  Performed by: Personally  Anesthesiologist: Beryle Lathe, MD  Additional Notes: No pain on injection. No increased resistance to injection. Injection made in 5cc increments. Good needle visualization. Patient tolerated the procedure well.

## 2020-06-18 ENCOUNTER — Encounter (HOSPITAL_COMMUNITY): Payer: Self-pay | Admitting: Orthopedic Surgery

## 2020-06-18 LAB — CBC
HCT: 39.8 % (ref 39.0–52.0)
Hemoglobin: 13.3 g/dL (ref 13.0–17.0)
MCH: 30 pg (ref 26.0–34.0)
MCHC: 33.4 g/dL (ref 30.0–36.0)
MCV: 89.6 fL (ref 80.0–100.0)
Platelets: 272 10*3/uL (ref 150–400)
RBC: 4.44 MIL/uL (ref 4.22–5.81)
RDW: 13.5 % (ref 11.5–15.5)
WBC: 16 10*3/uL — ABNORMAL HIGH (ref 4.0–10.5)
nRBC: 0 % (ref 0.0–0.2)

## 2020-06-18 LAB — BASIC METABOLIC PANEL
Anion gap: 8 (ref 5–15)
BUN: 20 mg/dL (ref 8–23)
CO2: 26 mmol/L (ref 22–32)
Calcium: 9 mg/dL (ref 8.9–10.3)
Chloride: 105 mmol/L (ref 98–111)
Creatinine, Ser: 0.9 mg/dL (ref 0.61–1.24)
GFR, Estimated: >60 mL/min (ref >60)
Glucose, Bld: 144 mg/dL — ABNORMAL HIGH (ref 70–99)
Potassium: 4.1 mmol/L (ref 3.5–5.1)
Sodium: 139 mmol/L (ref 135–145)

## 2020-06-18 LAB — HEMOGLOBIN A1C
Hgb A1c MFr Bld: 6.4 % — ABNORMAL HIGH (ref 4.8–5.6)
Mean Plasma Glucose: 137 mg/dL

## 2020-06-18 LAB — GLUCOSE, CAPILLARY: Glucose-Capillary: 132 mg/dL — ABNORMAL HIGH (ref 70–99)

## 2020-06-18 MED ORDER — OXYCODONE HCL 5 MG PO TABS: 5.0000 mg | ORAL_TABLET | Freq: Four times a day (QID) | ORAL | 0 refills | Status: DC | PRN

## 2020-06-18 MED ORDER — METHOCARBAMOL 500 MG PO TABS: 500.0000 mg | ORAL_TABLET | Freq: Four times a day (QID) | ORAL | 0 refills | Status: DC | PRN

## 2020-06-18 MED ORDER — ASPIRIN 325 MG PO TBEC: 325.0000 mg | DELAYED_RELEASE_TABLET | Freq: Two times a day (BID) | ORAL | 0 refills | Status: AC

## 2020-06-18 MED ORDER — TRAMADOL HCL 50 MG PO TABS: 50.0000 mg | ORAL_TABLET | Freq: Four times a day (QID) | ORAL | 0 refills | Status: DC | PRN

## 2020-06-18 NOTE — Progress Notes (Signed)
Physical Therapy Treatment Patient Details Name: Raymond [name] MRN: [id number] DOB: [birth year] Today's Date: 06/18/2020    History of Present Illness Patient is 64 y.o. male s/p Lt TKR (polyexchange) on 06/17/20 with PMH significant for HTN, GERD, DM, OA, asthma, Lt TKA in 2019.    PT Comments    Pt progressing well and eager for dc home.  Pt up to ambulate increased distance in hall, negotiate stairs and perform HEP with written instruction provided.  Pt eager for dc home.   Follow Up Recommendations  Follow surgeons recommendation for DC plan and follow-up therapies;Outpatient PT     Equipment Recommendations  None recommended by PT    Recommendations for Other Services       Precautions / Restrictions Precautions Precautions: Fall Restrictions Weight Bearing Restrictions: No    Mobility  Bed Mobility               General bed mobility comments: Up in chair and requests back to same  Transfers Overall transfer level: Needs assistance Equipment used: Rolling walker (2 wheeled) Transfers: Sit to/from Stand Sit to Stand: Supervision         General transfer comment: min cues for use of UEs  Ambulation/Gait Ambulation/Gait assistance: Supervision;Modified independent (Device/Increase time) Gait Distance (Feet): 200 Feet Assistive device: Rolling walker (2 wheeled) Gait Pattern/deviations: Step-through pattern;Decreased step length - right;Decreased step length - left;Shuffle Gait velocity: decr   General Gait Details: min cues for position from RW   Stairs Stairs: Yes Stairs assistance: Supervision Stair Management: Two rails;Forwards Number of Stairs: 4 General stair comments: min cues for sequence   Wheelchair Mobility    Modified Rankin (Stroke Patients Only)       Balance Overall balance assessment: Needs assistance Sitting-balance support: Feet supported Sitting balance-Leahy Scale: Good     Standing balance support: No upper  extremity supported Standing balance-Leahy Scale: Fair                              Cognition Arousal/Alertness: Awake/alert Behavior During Therapy: WFL for tasks assessed/performed Overall Cognitive Status: Within Functional Limits for tasks assessed                                        Exercises Total Joint Exercises Ankle Circles/Pumps: AROM;Both;15 reps;Seated Quad Sets: AROM;Left;10 reps;Seated Heel Slides: AROM;Left;Seated;15 reps Straight Leg Raises: AROM;15 reps;Supine;Left Long Arc Quad: AROM;Left;10 reps;Seated Knee Flexion: AAROM;Left;10 reps;Seated    General Comments        Pertinent Vitals/Pain Pain Assessment: No/denies pain    Home Living                      Prior Function            PT Goals (current goals can now be found in the care plan section) Acute Rehab PT Goals Patient Stated Goal: get back to independence, working outside PT Goal Formulation: With patient Time For Goal Achievement: 06/24/20 Potential to Achieve Goals: Good Progress towards PT goals: Progressing toward goals    Frequency    7X/week      PT Plan Current plan remains appropriate    Co-evaluation              AM-PAC PT "6 Clicks" Mobility   Outcome Measure  Help needed turning from your back to  your side while in a flat bed without using bedrails?: A Little Help needed moving from lying on your back to sitting on the side of a flat bed without using bedrails?: A Little Help needed moving to and from a bed to a chair (including a wheelchair)?: A Little Help needed standing up from a chair using your arms (e.g., wheelchair or bedside chair)?: A Little Help needed to walk in hospital room?: A Little Help needed climbing 3-5 steps with a railing? : A Little 6 Click Score: 18    End of Session Equipment Utilized During Treatment: Gait belt Activity Tolerance: Patient tolerated treatment well Patient left: in chair;with  call bell/phone within reach;with chair alarm set Nurse Communication: Mobility status PT Visit Diagnosis: Muscle weakness (generalized) (M62.81);Difficulty in walking, not elsewhere classified (R26.2)     Time: 5809-9833 PT Time Calculation (min) (ACUTE ONLY): 36 min  Charges:  $Gait Training: 8-22 mins $Therapeutic Exercise: 8-22 mins                     Mauro Kaufmann PT Acute Rehabilitation Services Pager [phone] Office [phone]    Daryn Pisani 06/18/2020, 9:28 AM

## 2020-06-18 NOTE — Progress Notes (Signed)
   Subjective: 1 Day Post-Op Procedure(s) (LRB): Left total knee arthroplasty poly revision (Left) Patient reports pain as mild.   Patient seen in rounds with Dr. Lequita Halt. Patient is well, and has had no acute complaints or problems other than soreness in the left knee. Denies chest pain, SOB, or calf pain. No issues overnight. Foley catheter to be removed this AM. We will continue therapy today.   Objective: Vital signs in last 24 hours: Temp:  [97.6 F (36.4 C)-98.9 F (37.2 C)] 98.6 F (37 C) (10/14 0537) Pulse Rate:  [65-101] 90 (10/14 0537) Resp:  [11-20] 16 (10/14 0537) BP: (108-153)/(69-90) 135/81 (10/14 0537) SpO2:  [95 %-100 %] 97 % (10/14 0537)  Intake/Output from previous day:  Intake/Output Summary (Last 24 hours) at 06/18/2020 0738 Last data filed at 06/18/2020 0530 Gross per 24 hour  Intake 2315.08 ml  Output 2230 ml  Net 85.08 ml     Intake/Output this shift: No intake/output data recorded.  Labs: Recent Labs    06/18/20 0355  HGB 13.3   Recent Labs    06/18/20 0355  WBC 16.0*  RBC 4.44  HCT 39.8  PLT 272   Recent Labs    06/18/20 0355  NA 139  K 4.1  CL 105  CO2 26  BUN 20  CREATININE 0.90  GLUCOSE 144*  CALCIUM 9.0   No results for input(s): LABPT, INR in the last 72 hours.  Exam: General - Patient is Alert and Oriented Extremity - Neurologically intact Neurovascular intact Sensation intact distally Dorsiflexion/Plantar flexion intact Dressing - dressing C/D/I Motor Function - intact, moving foot and toes well on exam.   Past Medical History:  Diagnosis Date  . Arthritis   . Asbestos exposure   . Asthma   . Diabetes mellitus without complication (HCC)    type 2   . GERD (gastroesophageal reflux disease)   . Hypertension   . Muscle spasms of neck   . Seasonal allergies   . Sleep apnea    cpap- does not know settings     Assessment/Plan: 1 Day Post-Op Procedure(s) (LRB): Left total knee arthroplasty poly revision  (Left) Principal Problem:   Failed total knee arthroplasty (HCC) Active Problems:   Failed total left knee replacement (HCC)  Estimated body mass index is 33.32 kg/m as calculated from the following:   Height as of this encounter: 6' 1.5" (1.867 m).   Weight as of this encounter: 116.1 kg. Advance diet Up with therapy D/C IV fluids  DVT Prophylaxis - Aspirin Weight bearing as tolerated. Continue therapy.  Plan is to go Home after hospital stay. Plan for discharge later today once meeting goals with PT. Scheduled for outpatient physical [facility] at [facility] location). Follow-up in the office in 2 weeks.   The PDMP database was reviewed today (06/18/2020) prior to any opioid medications being prescribed to this patient.   Arther Abbott, PA-C Orthopedic Surgery [phone] 06/18/2020, 7:38 AM

## 2020-06-18 NOTE — Plan of Care (Signed)
Pt discharged to home. Discharge teaching done, written information given.    Problem: Education: Goal: Knowledge of the prescribed therapeutic regimen will improve Outcome: Adequate for Discharge Goal: Individualized Educational Video(s) Outcome: Adequate for Discharge   Problem: Activity: Goal: Ability to avoid complications of mobility impairment will improve Outcome: Adequate for Discharge Goal: Range of joint motion will improve Outcome: Adequate for Discharge   Problem: Clinical Measurements: Goal: Postoperative complications will be avoided or minimized Outcome: Adequate for Discharge   Problem: Pain Management: Goal: Pain level will decrease with appropriate interventions Outcome: Adequate for Discharge   Problem: Skin Integrity: Goal: Will show signs of wound healing Outcome: Adequate for Discharge   Problem: Acute Rehab PT Goals(only PT should resolve) Goal: Pt Will Go Supine/Side To Sit Outcome: Adequate for Discharge Goal: Patient Will Transfer Sit To/From Stand Outcome: Adequate for Discharge Goal: Pt Will Ambulate Outcome: Adequate for Discharge Goal: Pt Will Go Up/Down Stairs Outcome: Adequate for Discharge

## 2020-06-22 NOTE — Discharge Summary (Signed)
Physician Discharge Summary   Patient ID: Raymond [name] MRN: [id number] DOB/AGE: [birth year] 62 y.o.  Admit date: 06/17/2020 Discharge date: 06/18/2020  Primary Diagnosis: Unstable left total knee arthroplasty   Admission Diagnoses:  Past Medical History:  Diagnosis Date   Arthritis    Asbestos exposure    Asthma    Diabetes mellitus without complication (HCC)    type 2    GERD (gastroesophageal reflux disease)    Hypertension    Muscle spasms of neck    Seasonal allergies    Sleep apnea    cpap- does not know settings    Discharge Diagnoses:   Principal Problem:   Failed total knee arthroplasty (HCC) Active Problems:   Failed total left knee replacement (HCC)  Estimated body mass index is 33.32 kg/m as calculated from the following:   Height as of this encounter: 6' 1.5" (1.867 m).   Weight as of this encounter: 116.1 kg.  Procedure:  Procedure(s) (LRB): Left total knee arthroplasty poly revision (Left)   Consults: None  HPI: The patient is a 64 year old male who had a left total knee arthroplasty done a few years ago.  He has developed mild instability but has had pain associated with this.  His prosthetic components appear normal and there were no  signs of infection.  He does have some instability in extension as well as flexion.  He presents now for polyethylene versus total knee revision.  Laboratory Data: Admission on 06/17/2020, Discharged on 06/18/2020  Component Date Value Ref Range Status   Glucose-Capillary 06/17/2020 127* 70 - 99 mg/dL Final   Glucose reference range applies only to samples taken after fasting for at least 8 hours.   Glucose-Capillary 06/17/2020 137* 70 - 99 mg/dL Final   Glucose reference range applies only to samples taken after fasting for at least 8 hours.   Hgb A1c MFr Bld 06/17/2020 6.4* 4.8 - 5.6 % Final   Comment: (NOTE)         Prediabetes: 5.7 - 6.4         Diabetes: >6.4         Glycemic control for adults  with diabetes: <7.0    Mean Plasma Glucose 06/17/2020 137  mg/dL Final   Comment: (NOTE) Performed At: [facility] [address] [place], [place] [id number] Jolene Schimke MD NW:[phone]    Glucose-Capillary 06/17/2020 136* 70 - 99 mg/dL Final   Glucose reference range applies only to samples taken after fasting for at least 8 hours.   Glucose-Capillary 06/17/2020 198* 70 - 99 mg/dL Final   Glucose reference range applies only to samples taken after fasting for at least 8 hours.   WBC 06/18/2020 16.0* 4.0 - 10.5 K/uL Final   RBC 06/18/2020 4.44  4.22 - 5.81 MIL/uL Final   Hemoglobin 06/18/2020 13.3  13.0 - 17.0 g/dL Final   HCT 57/84/6962 39.8  39 - 52 % Final   MCV 06/18/2020 89.6  80.0 - 100.0 fL Final   MCH 06/18/2020 30.0  26.0 - 34.0 pg Final   MCHC 06/18/2020 33.4  30.0 - 36.0 g/dL Final   RDW 95/28/4132 13.5  11.5 - 15.5 % Final   Platelets 06/18/2020 272  150 - 400 K/uL Final   nRBC 06/18/2020 0.0  0.0 - 0.2 % Final   Performed at [facility], [address] [place], [place] [address]   Sodium 06/18/2020 139  135 - 145 mmol/L Final   Potassium 06/18/2020 4.1  3.5 -  5.1 mmol/L Final   Chloride 06/18/2020 105  98 - 111 mmol/L Final   CO2 06/18/2020 26  22 - 32 mmol/L Final   Glucose, Bld 06/18/2020 144* 70 - 99 mg/dL Final   Glucose reference range applies only to samples taken after fasting for at least 8 hours.   BUN 06/18/2020 20  8 - 23 mg/dL Final   Creatinine, Ser 06/18/2020 0.90  0.61 - 1.24 mg/dL Final   Calcium 95/63/875610/14/2021 9.0  8.9 - 10.3 mg/dL Final   GFR, Estimated 06/18/2020 >60  >60 mL/min Final   Anion gap 06/18/2020 8  5 - 15 Final   Performed at [facility], [address] [place], [place] [address]   Glucose-Capillary 06/17/2020 140* 70 - 99 mg/dL Final   Glucose reference range applies only to samples taken after fasting for at least 8 hours.   Glucose-Capillary 06/18/2020 132*  70 - 99 mg/dL Final   Glucose reference range applies only to samples taken after fasting for at least 8 hours.  Hospital Outpatient Visit on 06/13/2020  Component Date Value Ref Range Status   SARS Coronavirus 2 06/13/2020 NEGATIVE  NEGATIVE Final   Comment: (NOTE) SARS-CoV-2 target nucleic acids are NOT DETECTED.  The SARS-CoV-2 RNA is generally detectable in upper and lower respiratory specimens during the acute phase of infection. Negative results do not preclude SARS-CoV-2 infection, do not rule out co-infections with other pathogens, and should not be used as the sole basis for treatment or other patient management decisions. Negative results must be combined with clinical observations, patient history, and epidemiological information. The expected result is Negative.  Fact Sheet for Patients: HairSlick.nohttps://www.fda.gov/media/138098/download  Fact Sheet for Healthcare Providers: quierodirigir.comhttps://www.fda.gov/media/138095/download  This test is not yet approved or cleared by the [place] FDA and  has been authorized for detection and/or diagnosis of SARS-CoV-2 by FDA under an Emergency Use Authorization (EUA). This EUA will remain  in effect (meaning this test can be used) for the duration of the COVID-19 declaration under Se                          ction 564(b)(1) of the Act, 21 U.S.C. section 360bbb-3(b)(1), unless the authorization is terminated or revoked sooner.  Performed at [facility], [address] [place], [place] [address]   Hospital Outpatient Visit on 06/04/2020  Component Date Value Ref Range Status   aPTT 06/04/2020 26  24 - 36 seconds Final   Performed at [facility], [address] [place], [place] [address]   WBC 06/04/2020 8.9  4.0 - 10.5 K/uL Final   RBC 06/04/2020 4.88  4.22 - 5.81 MIL/uL Final   Hemoglobin 06/04/2020 14.5  13.0 - 17.0 g/dL Final   HCT 30/16/010909/30/2021 44.3  39 - 52 % Final   MCV 06/04/2020 90.8  80.0 - 100.0 fL  Final   MCH 06/04/2020 29.7  26.0 - 34.0 pg Final   MCHC 06/04/2020 32.7  30.0 - 36.0 g/dL Final   RDW 32/35/573209/30/2021 14.0  11.5 - 15.5 % Final   Platelets 06/04/2020 328  150 - 400 K/uL Final   nRBC 06/04/2020 0.0  0.0 - 0.2 % Final   Performed at [facility], [address] [place], [place] [address]   Sodium 06/04/2020 136  135 - 145 mmol/L Final   Potassium 06/04/2020 4.5  3.5 - 5.1 mmol/L Final   Chloride 06/04/2020 101  98 -  111 mmol/L Final   CO2 06/04/2020 24  22 - 32 mmol/L Final   Glucose, Bld 06/04/2020 118* 70 - 99 mg/dL Final   Glucose reference range applies only to samples taken after fasting for at least 8 hours.   BUN 06/04/2020 20  8 - 23 mg/dL Final   Creatinine, Ser 06/04/2020 0.88  0.61 - 1.24 mg/dL Final   Calcium 61/44/3154 9.2  8.9 - 10.3 mg/dL Final   Total Protein 00/86/7619 7.5  6.5 - 8.1 g/dL Final   Albumin 50/93/2671 4.4  3.5 - 5.0 g/dL Final   AST 24/58/0998 26  15 - 41 U/L Final   ALT 06/04/2020 29  0 - 44 U/L Final   Alkaline Phosphatase 06/04/2020 49  38 - 126 U/L Final   Total Bilirubin 06/04/2020 0.8  0.3 - 1.2 mg/dL Final   GFR calc non Af Amer 06/04/2020 >60  >60 mL/min Final   GFR calc Af Amer 06/04/2020 >60  >60 mL/min Final   Anion gap 06/04/2020 11  5 - 15 Final   Performed at [facility], [address] [place], [place] [address]   Prothrombin Time 06/04/2020 11.4  11.4 - 15.2 seconds Final   INR 06/04/2020 0.9  0.8 - 1.2 Final   Comment: (NOTE) INR goal varies based on device and disease states. Performed at [facility], [address] [place], [place] [address]    ABO/RH(D) 06/04/2020 A POS   Final   Antibody Screen 06/04/2020 NEG   Final   Sample Expiration 06/04/2020 06/18/2020,2359   Final   Extend sample reason 06/04/2020    Final                   Value:NO TRANSFUSIONS OR PREGNANCY IN THE PAST 3 MONTHS Performed at [facility], [address], [place], [place] [address]    MRSA, PCR 06/04/2020 NEGATIVE  NEGATIVE Final   Staphylococcus aureus 06/04/2020 NEGATIVE  NEGATIVE Final   Comment: (NOTE) The Xpert SA Assay (FDA approved for NASAL specimens in patients 29 years of age and older), is one component of a comprehensive surveillance program. It is not intended to diagnose infection nor to guide or monitor treatment. Performed at [facility], [address], [place], [place] [address]      X-Rays:No results found.  EKG: Orders placed or performed during the hospital encounter of 06/30/19   ED EKG   ED EKG   EKG 12-Lead   EKG 12-Lead     Hospital Course: ANTWAIN CALIENDO is a 64 y.o. who was admitted to [facility]. They were brought to the operating room on 06/17/2020 and underwent Procedure(s): Left total knee arthroplasty poly revision.  Patient tolerated the procedure well and was later transferred to the recovery room and then to the orthopaedic floor for postoperative care. They were given PO and IV analgesics for pain control following their surgery. They were given 24 hours of postoperative antibiotics of  Anti-infectives (From admission, onward)   Start     Dose/Rate Route Frequency Ordered Stop   06/17/20 1430  ceFAZolin (ANCEF) IVPB 2g/100 mL premix        2 g 200 mL/hr over 30 Minutes Intravenous Every 6 hours 06/17/20 1114 06/17/20 2109   06/17/20 0645  ceFAZolin (ANCEF) IVPB 2g/100 mL premix        2 g 200 mL/hr over 30 Minutes Intravenous On call to O.R. 06/17/20 2409 06/17/20 7353  and started on DVT prophylaxis in the form of Aspirin.   PT and OT were ordered for total joint protocol. Discharge planning consulted to help with postop disposition and equipment needs.  Patient had a good night on the evening of surgery. They started to get up OOB with therapy on POD #0. Pt was seen during rounds and was ready to go home pending progress with  therapy. He worked with therapy on POD #1 and was meeting his goals. Pt was discharged to home later that day in stable condition.  Diet: Diabetic diet Activity: WBAT Follow-up: in 2 weeks Disposition: Home with OPPT Discharged Condition: stable   Discharge Instructions    Call MD / Call 911   Complete by: As directed    If you experience chest pain or shortness of breath, CALL 911 and be transported to the hospital emergency room.  If you develope a fever above 101 F, pus (white drainage) or increased drainage or redness at the wound, or calf pain, call your surgeon's office.   Change dressing   Complete by: As directed    You may remove the bulky bandage (ACE wrap and gauze) two days after surgery. You will have an adhesive waterproof bandage underneath. Leave this in place until your first follow-up appointment.   Constipation Prevention   Complete by: As directed    Drink plenty of fluids.  Prune juice may be helpful.  You may use a stool softener, such as Colace (over the counter) 100 mg twice a day.  Use MiraLax (over the counter) for constipation as needed.   Diet - low sodium heart healthy   Complete by: As directed    Do not put a pillow under the knee. Place it under the heel.   Complete by: As directed    Driving restrictions   Complete by: As directed    No driving for two weeks   TED hose   Complete by: As directed    Use stockings (TED hose) for three weeks on both leg(s).  You may remove them at night for sleeping.   Weight bearing as tolerated   Complete by: As directed      Allergies as of 06/18/2020      Reactions   Irbesartan Other (See Comments)   Sun sensitivity/skin burning      Medication List    STOP taking these medications   HYDROcodone-acetaminophen 10-325 MG tablet Commonly known as: NORCO     TAKE these medications   albuterol 108 (90 Base) MCG/ACT inhaler Commonly known as: VENTOLIN HFA Inhale 2 puffs into the lungs every 4 (four) hours  as needed for wheezing or shortness of breath.   amLODipine 10 MG tablet Commonly known as: NORVASC Take 10 mg by mouth daily.   aspirin 325 MG EC tablet Take 1 tablet (325 mg total) by mouth 2 (two) times daily for 20 days. Then take one 81 mg aspirin once a day for three weeks. Then discontinue aspirin.   atorvastatin 10 MG tablet Commonly known as: LIPITOR Take 10 mg by mouth at bedtime.   hydrocortisone valerate cream 0.2 % Commonly known as: WESTCORT Apply 1 application topically 2 (two) times daily as needed (skin irritation/rash.).   ipratropium 0.06 % nasal spray Commonly known as: ATROVENT Place 2 sprays into both nostrils 3 (three) times daily as needed for allergies.   metFORMIN 500 MG tablet Commonly known as: GLUCOPHAGE Take 500 mg by mouth 2 (two) times daily.   methocarbamol  500 MG tablet Commonly known as: ROBAXIN Take 1 tablet (500 mg total) by mouth every 6 (six) hours as needed for muscle spasms.   montelukast 10 MG tablet Commonly known as: SINGULAIR Take 10 mg by mouth at bedtime.   omeprazole 40 MG capsule Commonly known as: PRILOSEC Take 40 mg by mouth daily before breakfast.   oxyCODONE 5 MG immediate release tablet Commonly known as: Oxy IR/ROXICODONE Take 1-2 tablets (5-10 mg total) by mouth every 6 (six) hours as needed for severe pain.   Systane 0.4-0.3 % Soln Generic drug: Polyethyl Glycol-Propyl Glycol Place 1 drop into both eyes 3 (three) times daily as needed (dry/irritated eyes.).   traMADol 50 MG tablet Commonly known as: ULTRAM Take 1-2 tablets (50-100 mg total) by mouth every 6 (six) hours as needed for moderate pain.   Trelegy Ellipta 100-62.5-25 MCG/INH Aepb Generic drug: Fluticasone-Umeclidin-Vilant Inhale 1 puff into the lungs daily as needed for wheezing.   zolpidem 10 MG tablet Commonly known as: AMBIEN Take 10 mg by mouth at bedtime as needed for sleep.            Discharge Care Instructions  (From admission,  onward)         Start     Ordered   06/18/20 0000  Weight bearing as tolerated        06/18/20 0742   06/18/20 0000  Change dressing       Comments: You may remove the bulky bandage (ACE wrap and gauze) two days after surgery. You will have an adhesive waterproof bandage underneath. Leave this in place until your first follow-up appointment.   06/18/20 7322          Follow-up Information    Ollen Gross, MD. Schedule an appointment as soon as possible for a visit in 2 week(s).   Specialty: Orthopedic Surgery Contact information: [address] [place] [address] [place] [address] [phone]               Signed: Arther Abbott, PA-C Orthopedic Surgery 06/22/2020, 11:24 AM

## 2020-10-15 IMAGING — CT CT CHEST W/O CM
2 of 4 series · 15 of 36 positions shown, 18 images · non-contrast
Comparison: 06/06/2019

CLINICAL DATA: 63-year-old male with a history of follow-up chest
CT. Asymptomatic

EXAM:
CT CHEST WITHOUT CONTRAST
TECHNIQUE: Multidetector CT imaging of the chest was performed following the
standard protocol without IV contrast.

[Series 2: chest 2.00 · axial · 0.75mm/px · z∈[-1210,-912]mm · 12 of 177 slices shown, 15 images]
[im 14/177  mediastinal]
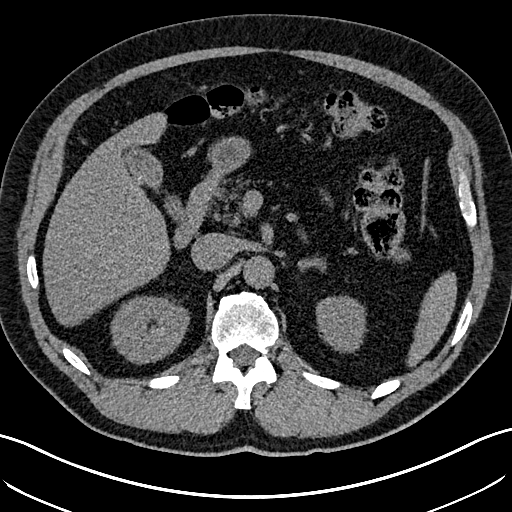
[im 14/177  lung]
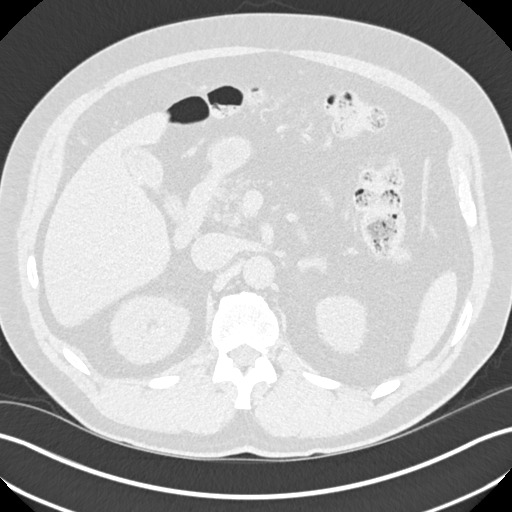
[im 28/177  lung]
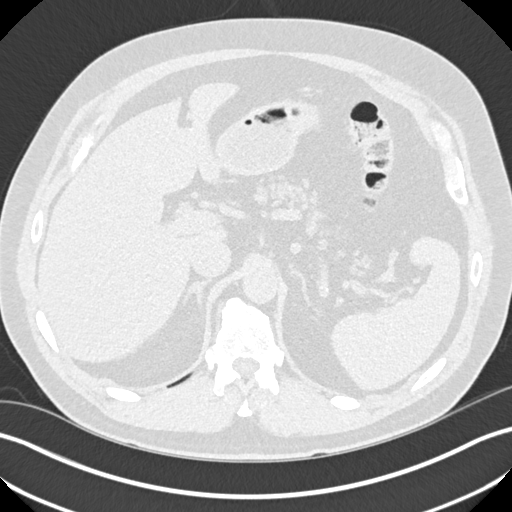
[im 41/177  lung]
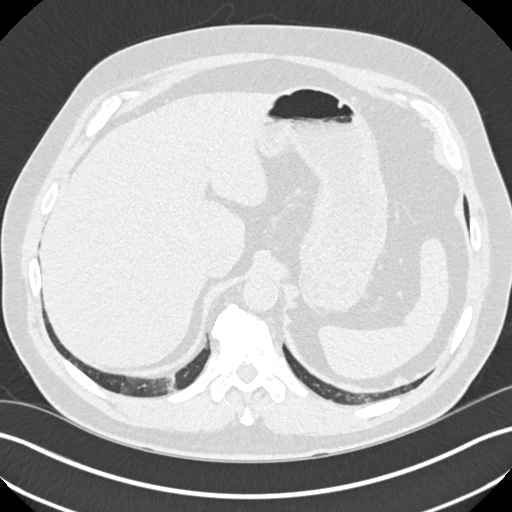
[im 55/177  lung]
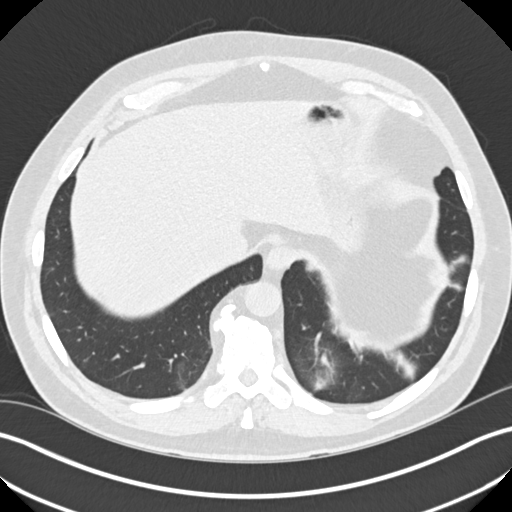
[im 68/177  mediastinal]
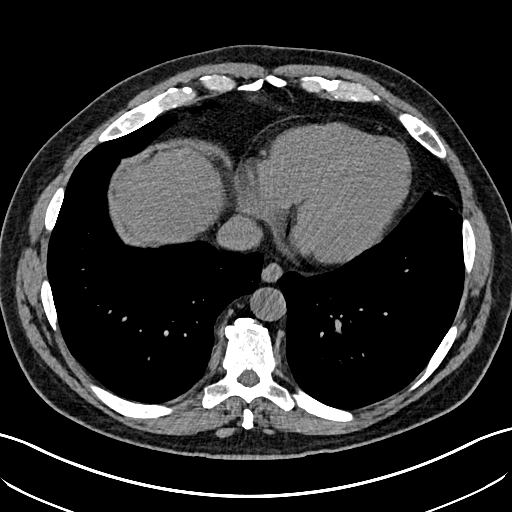
[im 68/177  lung]
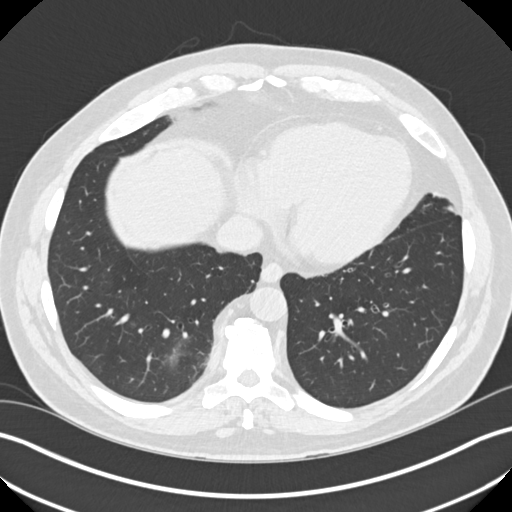
[im 82/177  lung]
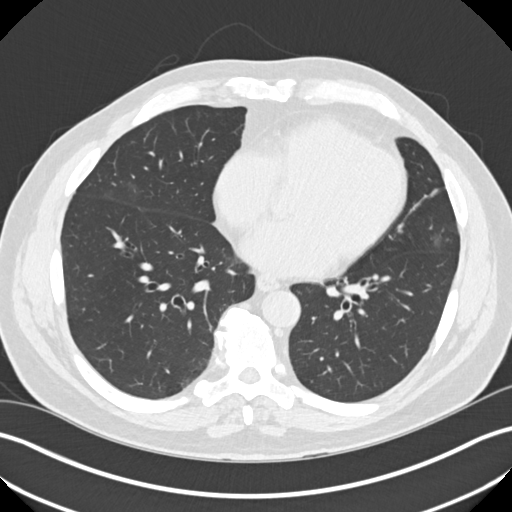
[im 95/177  lung]
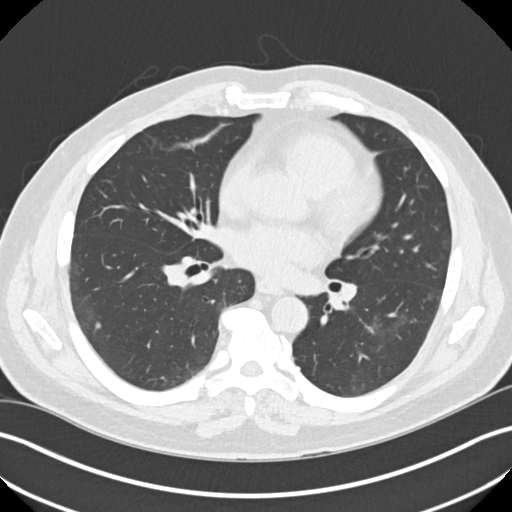
[im 109/177  lung]
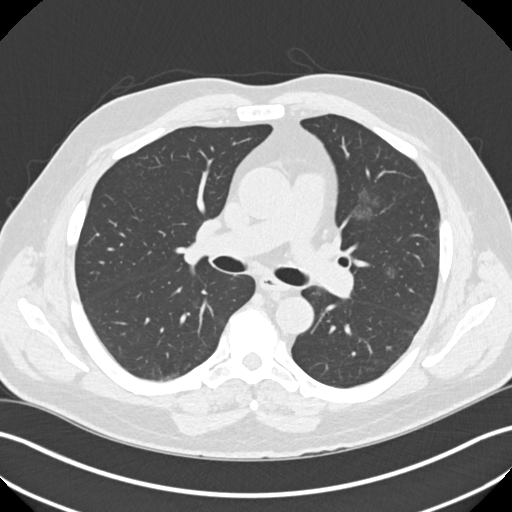
[im 122/177  mediastinal]
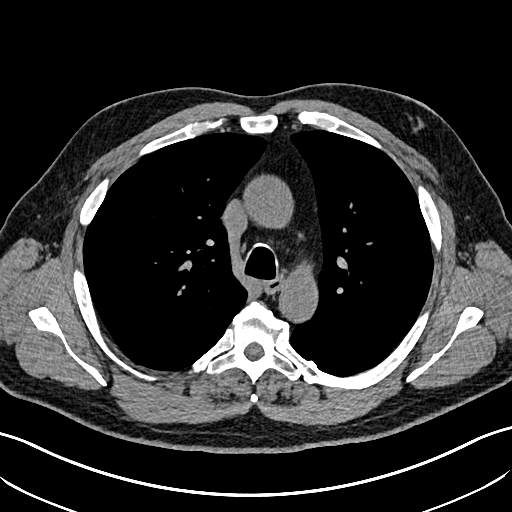
[im 122/177  lung]
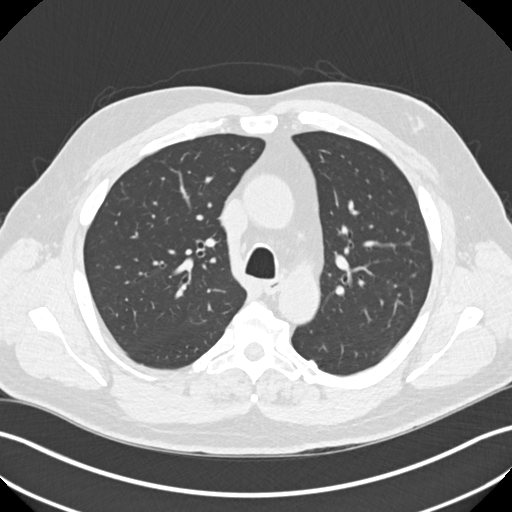
[im 136/177  lung]
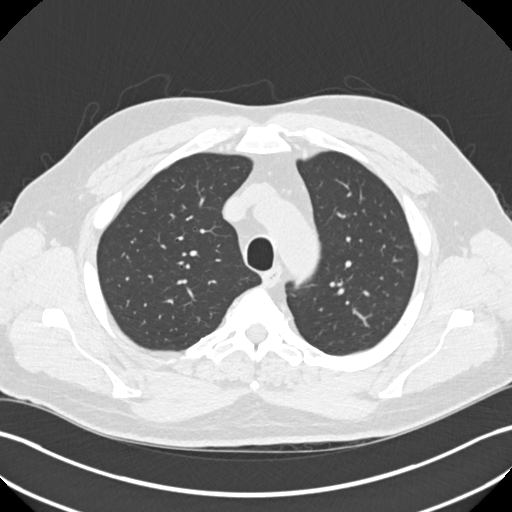
[im 149/177  lung]
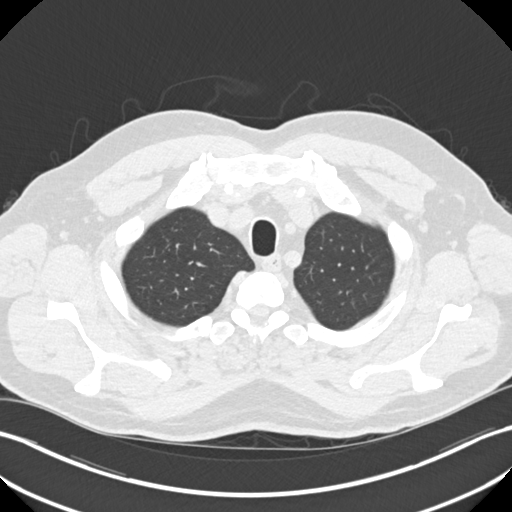
[im 163/177  lung]
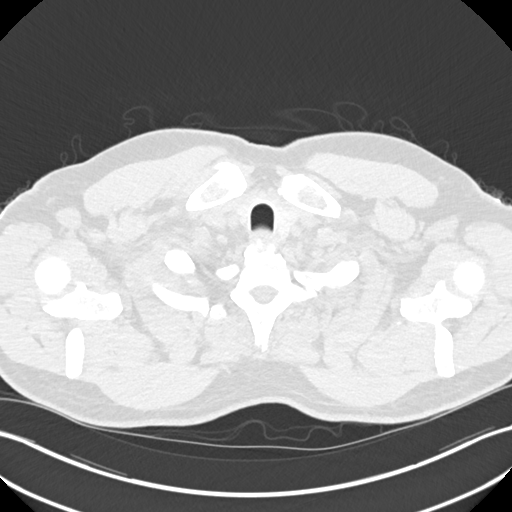

[Series 5: coronals chest 2.00 cor · coronal · 0.69mm/px · 3 of 168 slices shown]
[im 34/168  lung]
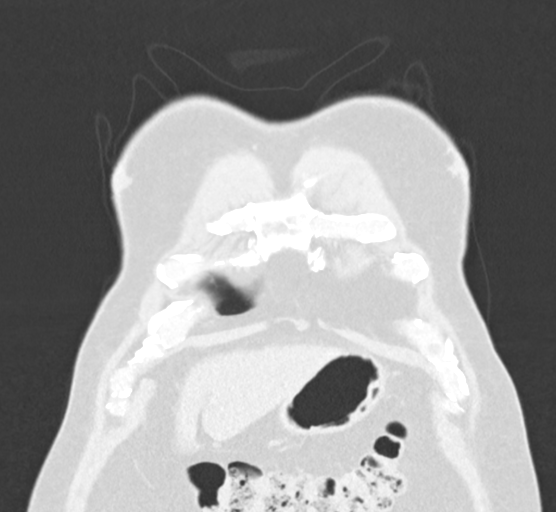
[im 67/168  lung]
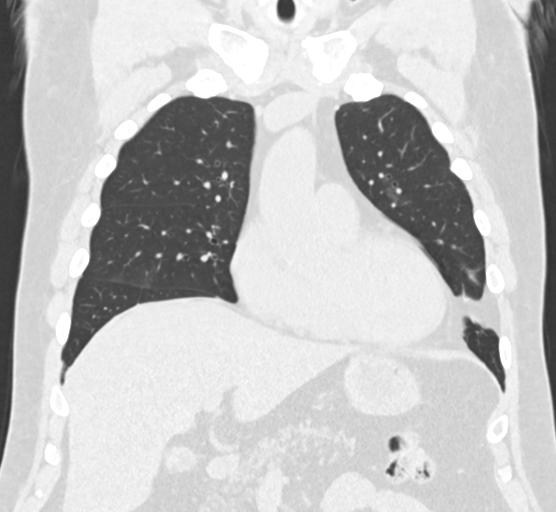
[im 101/168  lung]
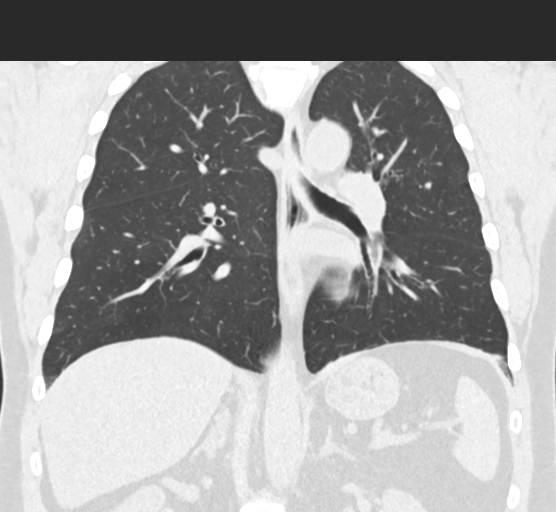

[15 of 36 positions shown; findings below may reference images not displayed]

FINDINGS: Cardiovascular: Heart size unchanged with no cardiomegaly. No
pericardial fluid/thickening. Minimal calcified coronary artery
disease of the left main and left anterior coronary arteries.
Unremarkable course caliber and contour of the thoracic aorta.
Minimal atherosclerosis.

Mediastinum/Nodes: Unremarkable thoracic inlet. No mediastinal
adenopathy. Unremarkable thoracic esophagus.

Lungs/Pleura: No pneumothorax or pleural effusion.

Mild bronchiectasis.

Mild pattern of patchy peripheral ground-glass throughout all lobes
of the lungs, similar distribution to the comparison without
significant change. No confluent airspace disease. Linear
atelectasis/scarring in the dependent lung bases, medial right
middle lobe, and the lingula.

Redemonstration of 6 mm nodule of the right lower lobe (image 82 of
series 3.

Upper Abdomen: No acute finding of the upper abdomen.

Musculoskeletal: Surgical changes of the cervical region
incompletely imaged. No acute displaced fracture. Degenerative
changes of the thoracic spine with mild kyphotic deformity. No
aggressive bony lesions identified.
IMPRESSION: Persistent pattern of mild patchy ground-glass opacities of the
bilateral lungs, most compatible with infectious/inflammatory
etiology, including possible viral infection.

Unchanged right lower lobe 6 mm nodule. Future, repeat CT at 18-24
months (from prior scan of 06/06/2019) is considered optional for
low-risk patients, but is recommended for high-risk patients. This
recommendation follows the consensus statement: Guidelines for
Management of Incidental Pulmonary Nodules Detected on CT Images:

## 2021-01-12 NOTE — Progress Notes (Signed)
Surgical Instructions    Your procedure is scheduled on Wednesday, May 18th.  Report to [facility] Main Entrance "A" at 6:30 A.M., then check in with the Admitting office.  Call this number if you have problems the morning of surgery:  [phone]   If you have any questions prior to your surgery date call [phone]: Open Monday-Friday 8am-4pm    Remember:  Do not eat or drink after midnight the night before your surgery    Take these medicines the morning of surgery with A SIP OF WATER   Albuterol inhale - if needed  Amlodipine (Norvasc)  Atorvastatin (Lipitor)  Nasal spray - if needed  Eye drops - if needed  Hydrocodone-Acetaminophen - if needed  Omeprazole (Prilosec)  Sucralfate (Carafate)  Trelegy Ellipta inhaler - if needed  As of today, STOP taking any Aspirin (unless otherwise instructed by your surgeon) Aleve, Naproxen, Ibuprofen, Motrin, Advil, Goody's, BC's, all herbal medications, fish oil, and all vitamins.   WHAT DO I DO ABOUT MY DIABETES MEDICATION?   Marland Kitchen Do not take oral diabetes medicines (pills) the morning of surgery. - Metformin   HOW TO MANAGE YOUR DIABETES BEFORE AND AFTER SURGERY  Why is it important to control my blood sugar before and after surgery? . Improving blood sugar levels before and after surgery helps healing and can limit problems. . A way of improving blood sugar control is eating a healthy diet by: o  Eating less sugar and carbohydrates o  Increasing activity/exercise o  Talking with your doctor about reaching your blood sugar goals . High blood sugars (greater than 180 mg/dL) can raise your risk of infections and slow your recovery, so you will need to focus on controlling your diabetes during the weeks before surgery. . Make sure that the doctor who takes care of your diabetes knows about your planned surgery including the date and location.  How do I manage my blood sugar before surgery? . Check your blood sugar at least 4 times  a day, starting 2 days before surgery, to make sure that the level is not too high or low. . Check your blood sugar the morning of your surgery when you wake up and every 2 hours until you get to the Short Stay unit. o If your blood sugar is less than 70 mg/dL, you will need to treat for low blood sugar: - Do not take insulin. - Treat a low blood sugar (less than 70 mg/dL) with  cup of clear juice (cranberry or apple), 4 glucose tablets, OR glucose gel. - Recheck blood sugar in 15 minutes after treatment (to make sure it is greater than 70 mg/dL). If your blood sugar is not greater than 70 mg/dL on recheck, call [phone] for further instructions. . Report your blood sugar to the short stay nurse when you get to Short Stay.  . If you are admitted to the hospital after surgery: o Your blood sugar will be checked by the staff and you will probably be given insulin after surgery (instead of oral diabetes medicines) to make sure you have good blood sugar levels. o The goal for blood sugar control after surgery is 80-180 mg/dL.                      DAY OF SURGERY:            Do not wear jewelry            Do not wear lotions, powders,  colognes, or deodorant.            You may shave face and neck.            Do not bring valuables to the hospital.            [facility] is not responsible for any belongings or valuables.  Do NOT Smoke (Tobacco/Vaping) or drink Alcohol 24 hours prior to your procedure If you use a CPAP at night, you may bring all equipment for your overnight stay.   Contacts, glasses, dentures or bridgework may not be worn into surgery, please bring cases for these belongings   For patients admitted to the hospital, discharge time will be determined by your treatment team.   Patients discharged the day of surgery will not be allowed to drive home, and someone needs to stay with them for 24 hours.    Special instructions:   [name]- Preparing For Surgery  Before  surgery, you can play an important role. Because skin is not sterile, your skin needs to be as free of germs as possible. You can reduce the number of germs on your skin by washing with CHG (chlorahexidine gluconate) Soap before surgery.  CHG is an antiseptic cleaner which kills germs and bonds with the skin to continue killing germs even after washing.    Oral Hygiene is also important to reduce your risk of infection.  Remember - BRUSH YOUR TEETH THE MORNING OF SURGERY WITH YOUR REGULAR TOOTHPASTE  Please do not use if you have an allergy to CHG or antibacterial soaps. If your skin becomes reddened/irritated stop using the CHG.  Do not shave (including legs and underarms) for at least 48 hours prior to first CHG shower. It is OK to shave your face.  Please follow these instructions carefully.   1. Shower the NIGHT BEFORE SURGERY and the MORNING OF SURGERY  2. If you chose to wash your hair, wash your hair first as usual with your normal shampoo.  3. After you shampoo, rinse your hair and body thoroughly to remove the shampoo.  4. Wash Face and genitals (private parts) with your normal soap.   5.  Shower the NIGHT BEFORE SURGERY and the MORNING OF SURGERY with CHG Soap.   6. Use CHG Soap as you would any other liquid soap. You can apply CHG directly to the skin and wash gently with a scrungie or a clean washcloth.   7. Apply the CHG Soap to your body ONLY FROM THE NECK DOWN.  Do not use on open wounds or open sores. Avoid contact with your eyes, ears, mouth and genitals (private parts). Wash Face and genitals (private parts)  with your normal soap.   8. Wash thoroughly, paying special attention to the area where your surgery will be performed.  9. Thoroughly rinse your body with warm water from the neck down.  10. DO NOT shower/wash with your normal soap after using and rinsing off the CHG Soap.  11. Pat yourself dry with a CLEAN TOWEL.  12. Wear CLEAN PAJAMAS to bed the night before  surgery  13. Place CLEAN SHEETS on your bed the night before your surgery  14. DO NOT SLEEP WITH PETS.   Day of Surgery: Take a shower with CHG soap. Wear Clean/Comfortable clothing the morning of surgery Do not apply any deodorants/lotions.   Remember to brush your teeth WITH YOUR REGULAR TOOTHPASTE.   Please read over the following fact sheets that you were given.

## 2021-01-13 ENCOUNTER — Encounter (HOSPITAL_COMMUNITY): Payer: Self-pay

## 2021-01-13 ENCOUNTER — Encounter (HOSPITAL_COMMUNITY)
Admission: RE | Admit: 2021-01-13 | Discharge: 2021-01-13 | Disposition: A | Discharge status: Home / Self Care | Source: Ambulatory Visit | Attending: Neurosurgery | Admitting: Neurosurgery

## 2021-01-13 ENCOUNTER — Other Ambulatory Visit: Payer: Self-pay

## 2021-01-13 DIAGNOSIS — Z01818 Encounter for other preprocedural examination: Secondary | ICD-10-CM | POA: Insufficient documentation

## 2021-01-13 HISTORY — DX: Chronic obstructive pulmonary disease, unspecified: J44.9

## 2021-01-13 LAB — CBC
HCT: 43.4 % (ref 39.0–52.0)
Hemoglobin: 14.3 g/dL (ref 13.0–17.0)
MCH: 29.8 pg (ref 26.0–34.0)
MCHC: 32.9 g/dL (ref 30.0–36.0)
MCV: 90.4 fL (ref 80.0–100.0)
Platelets: 352 10*3/uL (ref 150–400)
RBC: 4.8 MIL/uL (ref 4.22–5.81)
RDW: 13.6 % (ref 11.5–15.5)
WBC: 7.7 10*3/uL (ref 4.0–10.5)
nRBC: 0 % (ref 0.0–0.2)

## 2021-01-13 LAB — BASIC METABOLIC PANEL
Anion gap: 10 (ref 5–15)
BUN: 19 mg/dL (ref 8–23)
CO2: 23 mmol/L (ref 22–32)
Calcium: 9.4 mg/dL (ref 8.9–10.3)
Chloride: 103 mmol/L (ref 98–111)
Creatinine, Ser: 1.07 mg/dL (ref 0.61–1.24)
GFR, Estimated: >60 mL/min (ref >60)
Glucose, Bld: 167 mg/dL — ABNORMAL HIGH (ref 70–99)
Potassium: 3.9 mmol/L (ref 3.5–5.1)
Sodium: 136 mmol/L (ref 135–145)

## 2021-01-13 LAB — TYPE AND SCREEN
ABO/RH(D): A POS
Antibody Screen: NEGATIVE

## 2021-01-13 LAB — GLUCOSE, CAPILLARY: Glucose-Capillary: 186 mg/dL — ABNORMAL HIGH (ref 70–99)

## 2021-01-13 LAB — SURGICAL PCR SCREEN
MRSA, PCR: NEGATIVE
Staphylococcus aureus: NEGATIVE

## 2021-01-13 NOTE — Progress Notes (Signed)
Surgical Instructions    Your procedure is scheduled on Wednesday, May 18th.  Report to [facility] Main Entrance "A" at 6:30 A.M., then check in with the Admitting office.  Call this number if you have problems the morning of surgery:  [phone]   If you have any questions prior to your surgery date call [phone]: Open Monday-Friday 8am-4pm    Remember:  Do not eat or drink after midnight the night before your surgery    Take these medicines the morning of surgery with A SIP OF WATER   Albuterol inhale - if needed (Please bring all inhalers with you the day of surgery)  Amlodipine (Norvasc)  Fluticasone (Flonase) Nasal spray - if needed  Glycerin (Oasis Tears PF OP) Eye drops - if needed  Hydrocodone-Acetaminophen - if needed  Omeprazole (Prilosec)  Trelegy Ellipta inhaler  As of today, STOP taking any Aspirin (unless otherwise instructed by your surgeon) Aleve, Naproxen, Ibuprofen, Motrin, Advil, Goody's, BC's, all herbal medications, fish oil, and all vitamins.  WHAT DO I DO ABOUT MY DIABETES MEDICATION?  Marland Kitchen Do not take oral diabetes medicines (pills) the morning of surgery. - Metformin   HOW TO MANAGE YOUR DIABETES BEFORE AND AFTER SURGERY  Why is it important to control my blood sugar before and after surgery? . Improving blood sugar levels before and after surgery helps healing and can limit problems. . A way of improving blood sugar control is eating a healthy diet by: o  Eating less sugar and carbohydrates o  Increasing activity/exercise o  Talking with your doctor about reaching your blood sugar goals . High blood sugars (greater than 180 mg/dL) can raise your risk of infections and slow your recovery, so you will need to focus on controlling your diabetes during the weeks before surgery. . Make sure that the doctor who takes care of your diabetes knows about your planned surgery including the date and location.  How do I manage my blood sugar before  surgery? . Check your blood sugar at least 4 times a day, starting 2 days before surgery, to make sure that the level is not too high or low. . Check your blood sugar the morning of your surgery when you wake up and every 2 hours until you get to the Short Stay unit. o If your blood sugar is less than 70 mg/dL, you will need to treat for low blood sugar: - Do not take insulin. - Treat a low blood sugar (less than 70 mg/dL) with  cup of clear juice (cranberry or apple), 4 glucose tablets, OR glucose gel. - Recheck blood sugar in 15 minutes after treatment (to make sure it is greater than 70 mg/dL). If your blood sugar is not greater than 70 mg/dL on recheck, call [phone] for further instructions. . Report your blood sugar to the short stay nurse when you get to Short Stay.  . If you are admitted to the hospital after surgery: o Your blood sugar will be checked by the staff and you will probably be given insulin after surgery (instead of oral diabetes medicines) to make sure you have good blood sugar levels. o The goal for blood sugar control after surgery is 80-180 mg/dL.                      DAY OF SURGERY:            Do not wear jewelry  Do not wear lotions, powders, colognes, or deodorant.            You may shave face and neck.            Do not bring valuables to the hospital.            [facility] is not responsible for any belongings or valuables.  Do NOT Smoke (Tobacco/Vaping) or drink Alcohol 24 hours prior to your procedure  If you use a CPAP at night, you may bring all equipment for your overnight stay.   Contacts, glasses, hearing aids, dentures or partials may not be worn into surgery, please bring cases for these belongings   For patients admitted to the hospital, discharge time will be determined by your treatment team.   Patients discharged the day of surgery will not be allowed to drive home, and someone needs to stay with them for 24  hours.    Special instructions:   [facility]- Preparing For Surgery  Before surgery, you can play an important role. Because skin is not sterile, your skin needs to be as free of germs as possible. You can reduce the number of germs on your skin by washing with CHG (chlorahexidine gluconate) Soap before surgery.  CHG is an antiseptic cleaner which kills germs and bonds with the skin to continue killing germs even after washing.    Oral Hygiene is also important to reduce your risk of infection.  Remember - BRUSH YOUR TEETH THE MORNING OF SURGERY WITH YOUR REGULAR TOOTHPASTE  Please do not use if you have an allergy to CHG or antibacterial soaps. If your skin becomes reddened/irritated stop using the CHG.  Do not shave (including legs and underarms) for at least 48 hours prior to first CHG shower. It is OK to shave your face.  Please follow these instructions carefully.   1. Shower the NIGHT BEFORE SURGERY and the MORNING OF SURGERY  2. If you chose to wash your hair, wash your hair first as usual with your normal shampoo.  3. After you shampoo, rinse your hair and body thoroughly to remove the shampoo.  4. Wash Face and genitals (private parts) with your normal soap.   5.  Shower the NIGHT BEFORE SURGERY and the MORNING OF SURGERY with CHG Soap.   6. Use CHG Soap as you would any other liquid soap. You can apply CHG directly to the skin and wash gently with a scrungie or a clean washcloth.   7. Apply the CHG Soap to your body ONLY FROM THE NECK DOWN.  Do not use on open wounds or open sores. Avoid contact with your eyes, ears, mouth and genitals (private parts). Wash Face and genitals (private parts)  with your normal soap.   8. Wash thoroughly, paying special attention to the area where your surgery will be performed.  9. Thoroughly rinse your body with warm water from the neck down.  10. DO NOT shower/wash with your normal soap after using and rinsing off the CHG Soap.  11. Pat  yourself dry with a CLEAN TOWEL.  12. Wear CLEAN PAJAMAS to bed the night before surgery  13. Place CLEAN SHEETS on your bed the night before your surgery  14. DO NOT SLEEP WITH PETS.   Day of Surgery: Take a shower with CHG soap. Wear Clean/Comfortable clothing the morning of surgery Do not apply any deodorants/lotions.   Remember to brush your teeth WITH YOUR REGULAR TOOTHPASTE.   Please read over  the following fact sheets that you were given.

## 2021-01-13 NOTE — Progress Notes (Signed)
PCP - Marcelino Duster, MD Cardiologist - patient denies  PPM/ICD - n/a Device Orders -  Rep Notified -   Chest x-ray - n/a EKG - 01/13/21 Stress Test -  ECHO -  Cardiac Cath -   Sleep Study - 07/17/2019 CPAP - yes, nightly but most times removes it throughout the night  Fasting Blood Sugar - patient does not check CBG.  Last A1c on 11/19/2020, 7.2 Checks Blood Sugar _____ times a day  Blood Thinner Instructions: n/a Aspirin Instructions: n/a  ERAS Protcol - n/a PRE-SURGERY Ensure or G2-   COVID TEST- 01/18/2021   Anesthesia review:  n/a  Patient denies shortness of breath, fever, cough and chest pain at PAT appointment   All instructions explained to the patient, with a verbal understanding of the material. Patient agrees to go over the instructions while at home for a better understanding. Patient also instructed to self quarantine after being tested for COVID-19. The opportunity to ask questions was provided.

## 2021-01-18 ENCOUNTER — Other Ambulatory Visit: Payer: Self-pay

## 2021-01-18 ENCOUNTER — Other Ambulatory Visit
Admission: RE | Admit: 2021-01-18 | Discharge: 2021-01-18 | Disposition: A | Discharge status: Home / Self Care | Source: Ambulatory Visit | Attending: Neurosurgery | Admitting: Neurosurgery

## 2021-01-18 DIAGNOSIS — Z20822 Contact with and (suspected) exposure to covid-19: Secondary | ICD-10-CM | POA: Insufficient documentation

## 2021-01-18 DIAGNOSIS — Z01812 Encounter for preprocedural laboratory examination: Secondary | ICD-10-CM | POA: Diagnosis present

## 2021-01-18 LAB — SARS CORONAVIRUS 2 (TAT 6-24 HRS): SARS Coronavirus 2: NEGATIVE

## 2021-01-19 NOTE — Anesthesia Preprocedure Evaluation (Addendum)
Anesthesia Evaluation  Patient identified by MRN, date of birth, ID band Patient awake    Reviewed: Allergy & Precautions, NPO status , Patient's Chart, lab work & pertinent test results  History of Anesthesia Complications Negative for: history of anesthetic complications  Airway Mallampati: IV  TM Distance: >3 FB Neck ROM: Full    Dental  (+) Dental Advisory Given   Pulmonary sleep apnea and Continuous Positive Airway Pressure Ventilation , COPD,  COPD inhaler, former smoker,  01/18/2021 SARS coronavirus NEG   breath sounds clear to auscultation       Cardiovascular hypertension, Pt. on medications (-) angina Rhythm:Regular Rate:Normal     Neuro/Psych    GI/Hepatic Neg liver ROS, GERD  Medicated and Controlled,  Endo/Other  diabetes (glu 163), Oral Hypoglycemic AgentsMorbid obesity  Renal/GU negative Renal ROS     Musculoskeletal  (+) Arthritis ,   Abdominal (+) + obese,   Peds  Hematology   Anesthesia Other Findings   Reproductive/Obstetrics                            Anesthesia Physical Anesthesia Plan  ASA: III  Anesthesia Plan: General   Post-op Pain Management:    Induction: Intravenous  PONV Risk Score and Plan: 2 and Dexamethasone  Airway Management Planned: Oral ETT and Video Laryngoscope Planned  Additional Equipment: None  Intra-op Plan:   Post-operative Plan: Extubation in OR  Informed Consent: I have reviewed the patients History and Physical, chart, labs and discussed the procedure including the risks, benefits and alternatives for the proposed anesthesia with the patient or authorized representative who has indicated his/her understanding and acceptance.     Dental advisory given  Plan Discussed with: CRNA and Surgeon  Anesthesia Plan Comments:        Anesthesia Quick Evaluation

## 2021-01-20 ENCOUNTER — Ambulatory Visit (HOSPITAL_COMMUNITY)
Admission: RE | Admit: 2021-01-20 | Discharge: 2021-01-21 | Disposition: A | Discharge status: Home / Self Care | Source: Ambulatory Visit | Attending: Neurosurgery | Admitting: Neurosurgery

## 2021-01-20 ENCOUNTER — Encounter (HOSPITAL_COMMUNITY): Payer: Self-pay | Admitting: Neurosurgery

## 2021-01-20 ENCOUNTER — Encounter (HOSPITAL_COMMUNITY)
Admission: RE | Disposition: A | Discharge status: Home / Self Care | Payer: Self-pay | Source: Ambulatory Visit | Attending: Neurosurgery

## 2021-01-20 ENCOUNTER — Ambulatory Visit (HOSPITAL_COMMUNITY)

## 2021-01-20 ENCOUNTER — Ambulatory Visit (HOSPITAL_COMMUNITY): Admitting: Anesthesiology

## 2021-01-20 ENCOUNTER — Other Ambulatory Visit: Payer: Self-pay

## 2021-01-20 DIAGNOSIS — M4722 Other spondylosis with radiculopathy, cervical region: Secondary | ICD-10-CM | POA: Diagnosis present

## 2021-01-20 DIAGNOSIS — Z87891 Personal history of nicotine dependence: Secondary | ICD-10-CM | POA: Diagnosis not present

## 2021-01-20 DIAGNOSIS — Z7984 Long term (current) use of oral hypoglycemic drugs: Secondary | ICD-10-CM | POA: Insufficient documentation

## 2021-01-20 DIAGNOSIS — Z96652 Presence of left artificial knee joint: Secondary | ICD-10-CM | POA: Insufficient documentation

## 2021-01-20 DIAGNOSIS — M4712 Other spondylosis with myelopathy, cervical region: Secondary | ICD-10-CM | POA: Diagnosis present

## 2021-01-20 DIAGNOSIS — Z79891 Long term (current) use of opiate analgesic: Secondary | ICD-10-CM | POA: Diagnosis not present

## 2021-01-20 DIAGNOSIS — M4802 Spinal stenosis, cervical region: Secondary | ICD-10-CM | POA: Insufficient documentation

## 2021-01-20 DIAGNOSIS — Z79899 Other long term (current) drug therapy: Secondary | ICD-10-CM | POA: Insufficient documentation

## 2021-01-20 DIAGNOSIS — Z981 Arthrodesis status: Secondary | ICD-10-CM | POA: Insufficient documentation

## 2021-01-20 DIAGNOSIS — Z419 Encounter for procedure for purposes other than remedying health state, unspecified: Secondary | ICD-10-CM

## 2021-01-20 DIAGNOSIS — Z888 Allergy status to other drugs, medicaments and biological substances status: Secondary | ICD-10-CM | POA: Diagnosis not present

## 2021-01-20 HISTORY — PX: ANTERIOR CERVICAL DECOMP/DISCECTOMY FUSION: SHX1161

## 2021-01-20 LAB — GLUCOSE, CAPILLARY
Glucose-Capillary: 163 mg/dL — ABNORMAL HIGH (ref 70–99)
Glucose-Capillary: 177 mg/dL — ABNORMAL HIGH (ref 70–99)
Glucose-Capillary: 308 mg/dL — ABNORMAL HIGH (ref 70–99)
Glucose-Capillary: 194 mg/dL — ABNORMAL HIGH (ref 70–99)

## 2021-01-20 LAB — HEMOGLOBIN A1C
Hgb A1c MFr Bld: 7.3 % — ABNORMAL HIGH (ref 4.8–5.6)
Mean Plasma Glucose: 162.81 mg/dL

## 2021-01-20 SURGERY — ANTERIOR CERVICAL DECOMPRESSION/DISCECTOMY FUSION 1 LEVEL
Anesthesia: General | Site: Neck

## 2021-01-20 MED ORDER — BACITRACIN ZINC 500 UNIT/GM EX OINT
TOPICAL_OINTMENT | CUTANEOUS | Status: AC
  Filled 2021-01-20: qty 28.35

## 2021-01-20 MED ORDER — DEXAMETHASONE 4 MG PO TABS
4.0000 mg | ORAL_TABLET | Freq: Four times a day (QID) | ORAL | Status: AC
  Administered 2021-01-20 (×2): 4 mg via ORAL
  Filled 2021-01-20 (×2): qty 1

## 2021-01-20 MED ORDER — ACETAMINOPHEN 325 MG PO TABS: 650.0000 mg | ORAL_TABLET | ORAL | Status: DC | PRN

## 2021-01-20 MED ORDER — THROMBIN 5000 UNITS EX SOLR: OROMUCOSAL | Status: DC | PRN

## 2021-01-20 MED ORDER — MEPERIDINE HCL 25 MG/ML IJ SOLN: 6.2500 mg | INTRAMUSCULAR | Status: DC | PRN

## 2021-01-20 MED ORDER — ALBUTEROL SULFATE HFA 108 (90 BASE) MCG/ACT IN AERS
INHALATION_SPRAY | RESPIRATORY_TRACT | Status: DC | PRN
  Administered 2021-01-20: 2 via RESPIRATORY_TRACT

## 2021-01-20 MED ORDER — MONTELUKAST SODIUM 10 MG PO TABS
10.0000 mg | ORAL_TABLET | Freq: Every day | ORAL | Status: DC
  Administered 2021-01-20: 10 mg via ORAL
  Filled 2021-01-20: qty 1

## 2021-01-20 MED ORDER — PHENYLEPHRINE HCL-NACL 10-0.9 MG/250ML-% IV SOLN
INTRAVENOUS | Status: DC | PRN
  Administered 2021-01-20: 45 ug/min via INTRAVENOUS

## 2021-01-20 MED ORDER — CYCLOBENZAPRINE HCL 10 MG PO TABS
ORAL_TABLET | ORAL | Status: AC
  Filled 2021-01-20: qty 1

## 2021-01-20 MED ORDER — OXYCODONE HCL 5 MG PO TABS
ORAL_TABLET | ORAL | Status: AC
  Filled 2021-01-20: qty 1

## 2021-01-20 MED ORDER — ALUM & MAG HYDROXIDE-SIMETH 200-200-20 MG/5ML PO SUSP: 30.0000 mL | Freq: Four times a day (QID) | ORAL | Status: DC | PRN

## 2021-01-20 MED ORDER — ORAL CARE MOUTH RINSE: 15.0000 mL | Freq: Once | OROMUCOSAL | Status: AC

## 2021-01-20 MED ORDER — MIDAZOLAM HCL 2 MG/2ML IJ SOLN
INTRAMUSCULAR | Status: AC
  Filled 2021-01-20: qty 2

## 2021-01-20 MED ORDER — ONDANSETRON HCL 4 MG PO TABS: 4.0000 mg | ORAL_TABLET | Freq: Four times a day (QID) | ORAL | Status: DC | PRN

## 2021-01-20 MED ORDER — CYCLOBENZAPRINE HCL 10 MG PO TABS
10.0000 mg | ORAL_TABLET | Freq: Three times a day (TID) | ORAL | Status: DC | PRN
  Administered 2021-01-20 – 2021-01-21 (×3): 10 mg via ORAL
  Filled 2021-01-20 (×2): qty 1

## 2021-01-20 MED ORDER — ACETAMINOPHEN 500 MG PO TABS
1000.0000 mg | ORAL_TABLET | Freq: Four times a day (QID) | ORAL | Status: AC
  Administered 2021-01-20 – 2021-01-21 (×4): 1000 mg via ORAL
  Filled 2021-01-20 (×4): qty 2

## 2021-01-20 MED ORDER — DEXAMETHASONE SODIUM PHOSPHATE 4 MG/ML IJ SOLN: 4.0000 mg | Freq: Four times a day (QID) | INTRAMUSCULAR | Status: AC

## 2021-01-20 MED ORDER — BACITRACIN ZINC 500 UNIT/GM EX OINT
TOPICAL_OINTMENT | CUTANEOUS | Status: DC | PRN
  Administered 2021-01-20: 1 via TOPICAL

## 2021-01-20 MED ORDER — ACETAMINOPHEN 650 MG RE SUPP: 650.0000 mg | RECTAL | Status: DC | PRN

## 2021-01-20 MED ORDER — CHLORHEXIDINE GLUCONATE 0.12 % MT SOLN
15.0000 mL | Freq: Once | OROMUCOSAL | Status: AC
  Administered 2021-01-20: 15 mL via OROMUCOSAL
  Filled 2021-01-20: qty 15

## 2021-01-20 MED ORDER — FLUTICASONE FUROATE-VILANTEROL 100-25 MCG/INH IN AEPB
1.0000 | INHALATION_SPRAY | Freq: Every day | RESPIRATORY_TRACT | Status: DC
  Filled 2021-01-20: qty 28

## 2021-01-20 MED ORDER — PANTOPRAZOLE SODIUM 40 MG IV SOLR: 40.0000 mg | Freq: Every day | INTRAVENOUS | Status: DC

## 2021-01-20 MED ORDER — BISACODYL 10 MG RE SUPP: 10.0000 mg | Freq: Every day | RECTAL | Status: DC | PRN

## 2021-01-20 MED ORDER — METFORMIN HCL 500 MG PO TABS
500.0000 mg | ORAL_TABLET | Freq: Two times a day (BID) | ORAL | Status: DC
  Administered 2021-01-20 – 2021-01-21 (×2): 500 mg via ORAL
  Filled 2021-01-20 (×2): qty 1

## 2021-01-20 MED ORDER — ACETAMINOPHEN 500 MG PO TABS
1000.0000 mg | ORAL_TABLET | Freq: Once | ORAL | Status: AC
  Administered 2021-01-20: 1000 mg via ORAL
  Filled 2021-01-20: qty 2

## 2021-01-20 MED ORDER — DEXMEDETOMIDINE (PRECEDEX) IN NS 20 MCG/5ML (4 MCG/ML) IV SYRINGE
PREFILLED_SYRINGE | INTRAVENOUS | Status: DC | PRN
  Administered 2021-01-20: 8 ug via INTRAVENOUS
  Administered 2021-01-20: 4 ug via INTRAVENOUS
  Administered 2021-01-20: 8 ug via INTRAVENOUS

## 2021-01-20 MED ORDER — ZOLPIDEM TARTRATE 5 MG PO TABS
5.0000 mg | ORAL_TABLET | Freq: Every evening | ORAL | Status: DC | PRN
  Administered 2021-01-20: 5 mg via ORAL
  Filled 2021-01-20: qty 1

## 2021-01-20 MED ORDER — CEFAZOLIN SODIUM-DEXTROSE 2-4 GM/100ML-% IV SOLN
2.0000 g | INTRAVENOUS | Status: AC
  Administered 2021-01-20: 2 g via INTRAVENOUS
  Filled 2021-01-20: qty 100

## 2021-01-20 MED ORDER — LISINOPRIL 20 MG PO TABS: 20.0000 mg | ORAL_TABLET | Freq: Every day | ORAL | Status: DC

## 2021-01-20 MED ORDER — FLUTICASONE-UMECLIDIN-VILANT 100-62.5-25 MCG/INH IN AEPB: 1.0000 | INHALATION_SPRAY | Freq: Every day | RESPIRATORY_TRACT | Status: DC

## 2021-01-20 MED ORDER — MENTHOL 3 MG MT LOZG
1.0000 | LOZENGE | OROMUCOSAL | Status: DC | PRN
  Filled 2021-01-20: qty 9

## 2021-01-20 MED ORDER — ONDANSETRON HCL 4 MG/2ML IJ SOLN: 4.0000 mg | Freq: Four times a day (QID) | INTRAMUSCULAR | Status: DC | PRN

## 2021-01-20 MED ORDER — DOCUSATE SODIUM 100 MG PO CAPS
100.0000 mg | ORAL_CAPSULE | Freq: Two times a day (BID) | ORAL | Status: DC
  Administered 2021-01-20: 100 mg via ORAL
  Filled 2021-01-20: qty 1

## 2021-01-20 MED ORDER — ROCURONIUM BROMIDE 10 MG/ML (PF) SYRINGE
PREFILLED_SYRINGE | INTRAVENOUS | Status: DC | PRN
  Administered 2021-01-20: 30 mg via INTRAVENOUS
  Administered 2021-01-20: 70 mg via INTRAVENOUS
  Administered 2021-01-20: 30 mg via INTRAVENOUS

## 2021-01-20 MED ORDER — BUPIVACAINE-EPINEPHRINE 0.5% -1:200000 IJ SOLN
INTRAMUSCULAR | Status: AC
  Filled 2021-01-20: qty 1

## 2021-01-20 MED ORDER — PROPOFOL 10 MG/ML IV BOLUS
INTRAVENOUS | Status: DC | PRN
  Administered 2021-01-20: 200 mg via INTRAVENOUS

## 2021-01-20 MED ORDER — THROMBIN 5000 UNITS EX SOLR
CUTANEOUS | Status: AC
  Filled 2021-01-20: qty 5000

## 2021-01-20 MED ORDER — MIDAZOLAM HCL 2 MG/2ML IJ SOLN: 0.5000 mg | Freq: Once | INTRAMUSCULAR | Status: DC | PRN

## 2021-01-20 MED ORDER — MORPHINE SULFATE (PF) 4 MG/ML IV SOLN: 4.0000 mg | INTRAVENOUS | Status: DC | PRN

## 2021-01-20 MED ORDER — HYDROCHLOROTHIAZIDE 25 MG PO TABS: 25.0000 mg | ORAL_TABLET | Freq: Every day | ORAL | Status: DC

## 2021-01-20 MED ORDER — CEFAZOLIN SODIUM-DEXTROSE 2-4 GM/100ML-% IV SOLN
2.0000 g | Freq: Three times a day (TID) | INTRAVENOUS | Status: AC
  Administered 2021-01-20 – 2021-01-21 (×2): 2 g via INTRAVENOUS
  Filled 2021-01-20 (×2): qty 100

## 2021-01-20 MED ORDER — HYDROMORPHONE HCL 1 MG/ML IJ SOLN
INTRAMUSCULAR | Status: AC
  Filled 2021-01-20: qty 1

## 2021-01-20 MED ORDER — OXYCODONE HCL 5 MG PO TABS
5.0000 mg | ORAL_TABLET | Freq: Once | ORAL | Status: AC | PRN
  Administered 2021-01-20: 5 mg via ORAL

## 2021-01-20 MED ORDER — ONDANSETRON HCL 4 MG/2ML IJ SOLN
INTRAMUSCULAR | Status: DC | PRN
  Administered 2021-01-20: 4 mg via INTRAVENOUS

## 2021-01-20 MED ORDER — PANTOPRAZOLE SODIUM 40 MG PO TBEC
80.0000 mg | DELAYED_RELEASE_TABLET | Freq: Every day | ORAL | Status: DC
  Administered 2021-01-21: 80 mg via ORAL
  Filled 2021-01-20: qty 2

## 2021-01-20 MED ORDER — LISINOPRIL-HYDROCHLOROTHIAZIDE 20-25 MG PO TABS: 1.0000 | ORAL_TABLET | Freq: Every day | ORAL | Status: DC

## 2021-01-20 MED ORDER — SUCRALFATE 1 G PO TABS
1.0000 g | ORAL_TABLET | Freq: Four times a day (QID) | ORAL | Status: DC | PRN
  Filled 2021-01-20: qty 1

## 2021-01-20 MED ORDER — UMECLIDINIUM BROMIDE 62.5 MCG/INH IN AEPB
1.0000 | INHALATION_SPRAY | Freq: Every day | RESPIRATORY_TRACT | Status: DC
  Filled 2021-01-20: qty 7

## 2021-01-20 MED ORDER — PHENOL 1.4 % MT LIQD
1.0000 | OROMUCOSAL | Status: DC | PRN
  Filled 2021-01-20: qty 177

## 2021-01-20 MED ORDER — AMLODIPINE BESYLATE 10 MG PO TABS
10.0000 mg | ORAL_TABLET | Freq: Every day | ORAL | Status: DC
  Filled 2021-01-20: qty 1

## 2021-01-20 MED ORDER — LACTATED RINGERS IV SOLN: INTRAVENOUS | Status: DC

## 2021-01-20 MED ORDER — FENTANYL CITRATE (PF) 250 MCG/5ML IJ SOLN
INTRAMUSCULAR | Status: DC | PRN
  Administered 2021-01-20: 150 ug via INTRAVENOUS
  Administered 2021-01-20 (×2): 50 ug via INTRAVENOUS

## 2021-01-20 MED ORDER — CHLORHEXIDINE GLUCONATE CLOTH 2 % EX PADS: 6.0000 | MEDICATED_PAD | Freq: Once | CUTANEOUS | Status: DC

## 2021-01-20 MED ORDER — SUGAMMADEX SODIUM 200 MG/2ML IV SOLN
INTRAVENOUS | Status: DC | PRN
  Administered 2021-01-20: 200 mg via INTRAVENOUS

## 2021-01-20 MED ORDER — PROPOFOL 10 MG/ML IV BOLUS
INTRAVENOUS | Status: AC
  Filled 2021-01-20: qty 40

## 2021-01-20 MED ORDER — PROMETHAZINE HCL 25 MG/ML IJ SOLN: 6.2500 mg | INTRAMUSCULAR | Status: DC | PRN

## 2021-01-20 MED ORDER — ALBUTEROL SULFATE HFA 108 (90 BASE) MCG/ACT IN AERS: 2.0000 | INHALATION_SPRAY | RESPIRATORY_TRACT | Status: DC | PRN

## 2021-01-20 MED ORDER — LIDOCAINE 2% (20 MG/ML) 5 ML SYRINGE
INTRAMUSCULAR | Status: DC | PRN
  Administered 2021-01-20: 40 mg via INTRAVENOUS

## 2021-01-20 MED ORDER — HYDROMORPHONE HCL 1 MG/ML IJ SOLN
0.2500 mg | INTRAMUSCULAR | Status: DC | PRN
  Administered 2021-01-20 (×4): 0.5 mg via INTRAVENOUS

## 2021-01-20 MED ORDER — OXYCODONE HCL 5 MG PO TABS
10.0000 mg | ORAL_TABLET | ORAL | Status: DC | PRN
  Administered 2021-01-20 – 2021-01-21 (×7): 10 mg via ORAL
  Filled 2021-01-20 (×7): qty 2

## 2021-01-20 MED ORDER — BUPIVACAINE-EPINEPHRINE (PF) 0.5% -1:200000 IJ SOLN
INTRAMUSCULAR | Status: DC | PRN
  Administered 2021-01-20: 10 mL

## 2021-01-20 MED ORDER — OXYCODONE HCL 5 MG/5ML PO SOLN: 5.0000 mg | Freq: Once | ORAL | Status: AC | PRN

## 2021-01-20 MED ORDER — HYDROCODONE-ACETAMINOPHEN 10-325 MG PO TABS: 1.0000 | ORAL_TABLET | ORAL | Status: DC | PRN

## 2021-01-20 MED ORDER — FENTANYL CITRATE (PF) 250 MCG/5ML IJ SOLN
INTRAMUSCULAR | Status: AC
  Filled 2021-01-20: qty 5

## 2021-01-20 MED ORDER — FLUTICASONE PROPIONATE 50 MCG/ACT NA SUSP: 1.0000 | Freq: Every day | NASAL | Status: DC | PRN

## 2021-01-20 MED ORDER — ATORVASTATIN CALCIUM 10 MG PO TABS
10.0000 mg | ORAL_TABLET | Freq: Every day | ORAL | Status: DC
  Administered 2021-01-20: 10 mg via ORAL
  Filled 2021-01-20: qty 1

## 2021-01-20 MED ORDER — DEXAMETHASONE SODIUM PHOSPHATE 10 MG/ML IJ SOLN
INTRAMUSCULAR | Status: DC | PRN
  Administered 2021-01-20: 10 mg via INTRAVENOUS

## 2021-01-20 MED ORDER — 0.9 % SODIUM CHLORIDE (POUR BTL) OPTIME
TOPICAL | Status: DC | PRN
  Administered 2021-01-20: 1000 mL

## 2021-01-20 MED ORDER — INSULIN ASPART 100 UNIT/ML IJ SOLN
0.0000 [IU] | Freq: Three times a day (TID) | INTRAMUSCULAR | Status: DC
  Administered 2021-01-20: 15 [IU] via SUBCUTANEOUS
  Administered 2021-01-21: 4 [IU] via SUBCUTANEOUS

## 2021-01-20 MED ORDER — MIDAZOLAM HCL 5 MG/5ML IJ SOLN
INTRAMUSCULAR | Status: DC | PRN
  Administered 2021-01-20: 2 mg via INTRAVENOUS

## 2021-01-20 SURGICAL SUPPLY — 54 items
BAND RUBBER #18 3X1/16 STRL (MISCELLANEOUS) IMPLANT
BENZOIN TINCTURE PRP APPL 2/3 (GAUZE/BANDAGES/DRESSINGS) ×2 IMPLANT
BIT DRILL NEURO 2X3.1 SFT TUCH (MISCELLANEOUS) ×1 IMPLANT
BLADE SURG 15 STRL LF DISP TIS (BLADE) IMPLANT
BLADE SURG 15 STRL SS (BLADE)
BLADE ULTRA TIP 2M (BLADE) ×2 IMPLANT
BUR BARREL STRAIGHT FLUTE 4.0 (BURR) ×2 IMPLANT
BUR MATCHSTICK NEURO 3.0 LAGG (BURR) ×2 IMPLANT
CANISTER SUCT 3000ML PPV (MISCELLANEOUS) ×2 IMPLANT
CARTRIDGE OIL MAESTRO DRILL (MISCELLANEOUS) ×1 IMPLANT
COVER MAYO STAND STRL (DRAPES) ×2 IMPLANT
COVER WAND RF STERILE (DRAPES) IMPLANT
DECANTER SPIKE VIAL GLASS SM (MISCELLANEOUS) ×2 IMPLANT
DEVICE FUSION VISTA 14X14X9MM (Spacer) ×1 IMPLANT
DIFFUSER DRILL AIR PNEUMATIC (MISCELLANEOUS) ×2 IMPLANT
DRAPE LAPAROTOMY 100X72 PEDS (DRAPES) ×2 IMPLANT
DRAPE MICROSCOPE LEICA (MISCELLANEOUS) IMPLANT
DRAPE SURG 17X23 STRL (DRAPES) ×4 IMPLANT
DRILL NEURO 2X3.1 SOFT TOUCH (MISCELLANEOUS) ×2
DRSG OPSITE POSTOP 3X4 (GAUZE/BANDAGES/DRESSINGS) IMPLANT
DRSG OPSITE POSTOP 4X6 (GAUZE/BANDAGES/DRESSINGS) ×2 IMPLANT
ELECT REM PT RETURN 9FT ADLT (ELECTROSURGICAL) ×2
ELECTRODE REM PT RTRN 9FT ADLT (ELECTROSURGICAL) ×1 IMPLANT
GAUZE 4X4 16PLY RFD (DISPOSABLE) IMPLANT
GLOVE BIO SURGEON STRL SZ8 (GLOVE) ×2 IMPLANT
GLOVE BIO SURGEON STRL SZ8.5 (GLOVE) ×2 IMPLANT
GLOVE EXAM NITRILE XL STR (GLOVE) IMPLANT
GOWN STRL REUS W/ TWL LRG LVL3 (GOWN DISPOSABLE) ×3 IMPLANT
GOWN STRL REUS W/ TWL XL LVL3 (GOWN DISPOSABLE) ×2 IMPLANT
GOWN STRL REUS W/TWL LRG LVL3 (GOWN DISPOSABLE) ×3
GOWN STRL REUS W/TWL XL LVL3 (GOWN DISPOSABLE) ×2
HEMOSTAT POWDER KIT SURGIFOAM (HEMOSTASIS) ×2 IMPLANT
KIT BASIN OR (CUSTOM PROCEDURE TRAY) ×2 IMPLANT
KIT TURNOVER KIT B (KITS) ×2 IMPLANT
MARKER SKIN DUAL TIP RULER LAB (MISCELLANEOUS) IMPLANT
NEEDLE HYPO 22GX1.5 SAFETY (NEEDLE) ×2 IMPLANT
NEEDLE SPNL 18GX3.5 QUINCKE PK (NEEDLE) ×2 IMPLANT
NS IRRIG 1000ML POUR BTL (IV SOLUTION) ×2 IMPLANT
OIL CARTRIDGE MAESTRO DRILL (MISCELLANEOUS) ×2
PACK LAMINECTOMY NEURO (CUSTOM PROCEDURE TRAY) ×2 IMPLANT
PIN DISTRACTION 14MM (PIN) ×4 IMPLANT
PLATE ANT CERV XTEND ELD 1 L14 (Plate) ×2 IMPLANT
PUTTY DBM 2CC CALC GRAN (Putty) ×2 IMPLANT
SCREW XTD VAR 4.2 SELF TAP (Screw) ×8 IMPLANT
SPONGE INTESTINAL PEANUT (DISPOSABLE) ×2 IMPLANT
SPONGE SURGIFOAM ABS GEL SZ50 (HEMOSTASIS) IMPLANT
STRIP CLOSURE SKIN 1/2X4 (GAUZE/BANDAGES/DRESSINGS) ×2 IMPLANT
SUT VIC AB 0 CT1 27 (SUTURE) ×1
SUT VIC AB 0 CT1 27XBRD ANTBC (SUTURE) ×1 IMPLANT
SUT VIC AB 3-0 SH 8-18 (SUTURE) ×2 IMPLANT
TOWEL GREEN STERILE (TOWEL DISPOSABLE) ×2 IMPLANT
TOWEL GREEN STERILE FF (TOWEL DISPOSABLE) ×2 IMPLANT
VISTA 14X14X9MM (Spacer) ×2 IMPLANT
WATER STERILE IRR 1000ML POUR (IV SOLUTION) ×2 IMPLANT

## 2021-01-20 NOTE — H&P (Signed)
Subjective: The patient is a 65 year old white male on whom another physician performed a C4-5 and C5-6 anterior cervicectomy, fusion and plating years ago.  The patient has done well until recently when he developed worsening neck pain with pain into his bilateral shoulders.  He failed medical management.  He was worked up with a cervical MRI which demonstrated spondylosis and stenosis at C3-4.  I discussed the various treatment options with him.  He has decided proceed with surgery.  Past Medical History:  Diagnosis Date  . Arthritis   . Asbestos exposure   . Asthma   . COPD (chronic obstructive pulmonary disease) (HCC)   . Diabetes mellitus without complication (HCC)    type 2   . GERD (gastroesophageal reflux disease)   . Hypertension   . Muscle spasms of neck   . Seasonal allergies   . Sleep apnea    cpap- does not know settings     Past Surgical History:  Procedure Laterality Date  . CARPAL TUNNEL RELEASE Bilateral   . CERVICAL FUSION    . HAND SURGERY Right    trigger finger release  . KNEE ARTHROSCOPY Left 08/21/2013   Procedure: LEFT ARTHROSCOPY KNEE WITH DEBRIDEMENT AND CHONDROPLASTY;  Surgeon: Loanne Drilling, MD;  Location: [facility];  Service: Orthopedics;  Laterality: Left;  . NASAL SINUS SURGERY    . TOTAL KNEE ARTHROPLASTY Left 06/11/2018   Procedure: LEFT TOTAL KNEE ARTHROPLASTY;  Surgeon: Ollen Gross, MD;  Location: [facility];  Service: Orthopedics;  Laterality: Left;  . TOTAL KNEE REVISION Left 06/17/2020   Procedure: Left total knee arthroplasty poly revision;  Surgeon: Ollen Gross, MD;  Location: [facility];  Service: Orthopedics;  Laterality: Left;     Allergies  Allergen Reactions  . Irbesartan Other (See Comments)    Sun sensitivity/skin burning    Social History   Tobacco Use  . Smoking status: Former Smoker    Packs/day: 2.00    Years: 15.00    Pack years: 30.00    Quit date: 08/19/1989    Years since quitting: 31.4  . Smokeless tobacco: Never  Used  Substance Use Topics  . Alcohol use: Not Currently    History reviewed. No pertinent family history. Prior to Admission medications   Medication Sig Start Date End Date Taking? Authorizing Provider  albuterol (PROVENTIL HFA;VENTOLIN HFA) 108 (90 Base) MCG/ACT inhaler Inhale 2 puffs into the lungs every 4 (four) hours as needed for wheezing or shortness of breath.   Yes [provider]  amLODipine (NORVASC) 10 MG tablet Take 10 mg by mouth daily.   Yes [provider]  atorvastatin (LIPITOR) 10 MG tablet Take 10 mg by mouth at bedtime.   Yes [provider]  fluticasone (FLONASE) 50 MCG/ACT nasal spray Place 1 spray into both nostrils daily as needed for allergies or rhinitis.   Yes [provider]  Glycerin (OASIS TEARS PF OP) Place 1 drop into both eyes 2 (two) times daily as needed (dry/irritated eyes).   Yes [provider]  HYDROcodone-acetaminophen (NORCO) 10-325 MG tablet Take 1 tablet by mouth every 6 (six) hours. 12/08/20  Yes [provider]  hydrocortisone valerate cream (WESTCORT) 0.2 % Apply 1 application topically 2 (two) times daily as needed (skin irritation/rash.).   Yes [provider]  lisinopril-hydrochlorothiazide (ZESTORETIC) 20-25 MG tablet Take 1 tablet by mouth daily.   Yes [provider]  metFORMIN (GLUCOPHAGE) 500 MG tablet Take 500 mg by mouth 2 (two) times daily  with a meal. 05/20/20  Yes [provider]  montelukast (SINGULAIR) 10 MG tablet Take 10 mg by mouth at bedtime.   Yes [provider]  omeprazole (PRILOSEC) 40 MG capsule Take 40 mg by mouth daily before breakfast.    Yes [provider]  sucralfate (CARAFATE) 1 g tablet Take 1 g by mouth 4 (four) times daily as needed (acid reflux). 12/21/20  Yes [provider]  TRELEGY ELLIPTA 100-62.5-25 MCG/INH AEPB Inhale 1 puff into the lungs daily. 05/18/20  Yes [provider]  zolpidem (AMBIEN) 10 MG  tablet Take 10 mg by mouth at bedtime as needed for sleep.   Yes [provider]  methocarbamol (ROBAXIN) 500 MG tablet Take 1 tablet (500 mg total) by mouth every 6 (six) hours as needed for muscle spasms. Patient not taking: No sig reported 06/18/20   Edmisten, Kristie L, PA  oxyCODONE (OXY IR/ROXICODONE) 5 MG immediate release tablet Take 1-2 tablets (5-10 mg total) by mouth every 6 (six) hours as needed for severe pain. Patient not taking: No sig reported 06/18/20   Edmisten, Kristie L, PA  traMADol (ULTRAM) 50 MG tablet Take 1-2 tablets (50-100 mg total) by mouth every 6 (six) hours as needed for moderate pain. Patient not taking: No sig reported 06/18/20   Arther Abbott L, PA     Review of Systems  Positive ROS: As above  All other systems have been reviewed and were otherwise negative with the exception of those mentioned in the HPI and as above.  Objective: Vital signs in last 24 hours: Temp:  [98.3 F (36.8 C)] 98.3 F (36.8 C) (05/18 0643) Pulse Rate:  [82] 82 (05/18 0643) Resp:  [18] 18 (05/18 0643) BP: (127)/(72) 127/72 (05/18 0643) SpO2:  [96 %] 96 % (05/18 0643) Weight:  [122.5 kg] 122.5 kg (05/18 0643) Estimated body mass index is 34.67 kg/m as calculated from the following:   Height as of this encounter: 6\' 2"  (1.88 m).   Weight as of this encounter: 122.5 kg.   General Appearance: Alert Head: Normocephalic, without obvious abnormality, atraumatic Eyes: PERRL, conjunctiva/corneas clear, EOM's intact,    Ears: Normal  Throat: Normal  Neck: Limited range of motion.  His left anterior incision is well-healed. Back: unremarkable Lungs: Clear to auscultation bilaterally, respirations unlabored Heart: Regular rate and rhythm, no murmur, rub or gallop Abdomen: Soft, non-tender Extremities: Extremities normal, atraumatic, no cyanosis or edema Skin: unremarkable  NEUROLOGIC:   Mental status: alert and oriented,Motor Exam - grossly normal Sensory Exam  - grossly normal Reflexes:  Coordination - grossly normal Gait - grossly normal Balance - grossly normal Cranial Nerves: I: smell Not tested  II: visual acuity  OS: Normal  OD: Normal   II: visual fields Full to confrontation  II: pupils Equal, round, reactive to light  III,VII: ptosis None  III,IV,VI: extraocular muscles  Full ROM  V: mastication Normal  V: facial light touch sensation  Normal  V,VII: corneal reflex  Present  VII: facial muscle function - upper  Normal  VII: facial muscle function - lower Normal  VIII: hearing Not tested  IX: soft palate elevation  Normal  IX,X: gag reflex Present  XI: trapezius strength  5/5  XI: sternocleidomastoid strength 5/5  XI: neck flexion strength  5/5  XII: tongue strength  Normal    Data Review Lab Results  Component Value Date   WBC 7.7 01/13/2021   HGB 14.3 01/13/2021   HCT 43.4 01/13/2021   MCV  90.4 01/13/2021   PLT 352 01/13/2021   Lab Results  Component Value Date   NA 136 01/13/2021   K 3.9 01/13/2021   CL 103 01/13/2021   CO2 23 01/13/2021   BUN 19 01/13/2021   CREATININE 1.07 01/13/2021   GLUCOSE 167 (H) 01/13/2021   Lab Results  Component Value Date   INR 0.9 06/04/2020    Assessment/Plan: C3-4 spondylosis, cervicalgia, cervical radiculopathy: I have discussed the situation with the patient.  I reviewed his imaging studies with him and pointed out the abnormalities.  We have discussed the various treatment options including surgery.  I have described the surgical treatment option of a C3-4 anterior cervical discectomy, fusion and plating with possible removal of his old hardware.  I described the surgery to him.  I have shown him surgical models.  I have given him a surgical pamphlet.  We have discussed the risk, benefits, alternatives, expected postoperative course, and likelihood of achieving our goals with surgery.  I have answered all his questions.  He has decided proceed with surgery.   Cristi Loron 01/20/2021 8:24 AM

## 2021-01-20 NOTE — Progress Notes (Signed)
Orthopedic Tech Progress Note Patient Details:  Raymond [name] 16-Mar-1956 [id number] Dropped off [facility]ERV[facility] Patient ID: Raymond [name], male   DOB: 03-10-1956, 65 y.o.   MRN: [id number]   [name] 01/20/2021, 12:03 PM

## 2021-01-20 NOTE — Op Note (Signed)
Brief history: The patient is a 65 year old white male on whom another physician performed a C4-5 and C5-6 anterior cervicectomy, fusion and plating years ago.  He has done well until recently when he developed recurrent neck pain with pain into his bilateral shoulders.  He has failed medical management.  He was worked up with a cervical MRI which demonstrated spondylosis and stenosis at C3-4.  I discussed the various treatment options with him.  He has decided to proceed with surgery.  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 Delma Officer  Asst.: Dr. Hoyt Koch and Hildred Priest, NP  Anesthesia: Gen. endotracheal  Estimated blood loss: 75 cc  Drains: None  Complications: None  Description of procedure: The patient was brought to the operating room by the anesthesia team. General endotracheal anesthesia was induced. A roll was placed under the patient's shoulders to keep the neck in the neutral position. The patient's anterior cervical region was then prepared with Betadine scrub and Betadine solution. Sterile drapes were applied.  The area to be incised was then injected with Marcaine with epinephrine solution. I then used a scalpel to make a transverse incision in the patient's left anterior neck. I used the Metzenbaum scissors to divide the platysmal muscle and then to  dissect through the scar tissue from the previous operation and todissect medial to the sternocleidomastoid muscle, jugular vein, and carotid artery. I carefully dissected down towards the anterior cervical spine identifying the  esophagus and retracting it medially. Then using Kitner swabs to clear soft tissue from the anterior cervical spine and to expose the upper aspect of the old plate.  I then used electrocautery to detach the medial border of the longus colli muscle bilaterally from the C3-4 intervertebral disc spaces. I then inserted the Caspar self-retaining retractor underneath the longus colli muscle bilaterally to provide exposure.  We then incised the intervertebral disc at C3-4. We then performed a partial intervertebral discectomy with a pituitary forceps and the Karlin curettes. I then inserted distraction screws into the vertebral bodies at C3 and C4. We then distracted the interspace. We then used the high-speed drill to decorticate the vertebral endplates at C3-4, to drill away the remainder of the intervertebral disc, to drill away some posterior spondylosis, and to thin out the posterior longitudinal ligament. I then incised ligament with the arachnoid knife. We then removed the ligament with a Kerrison punches undercutting the vertebral endplates and decompressing the thecal sac. We then performed foraminotomies about the bilateral C4 nerve roots. This completed the decompression at this level.  We now turned our to attention to the interbody fusion. We used the trial spacers to determine the appropriate size for the interbody prosthesis. We then pre-filled prosthesis with a combination of local morcellized autograft bone that we obtained during decompression as well as Zimmer DBM. We then inserted the prosthesis into the distracted interspace at C3-4. We then removed the distraction screws. There was a good snug fit of the prosthesis in the interspace.  Having completed the fusion we now turned attention to the anterior spinal instrumentation.  There was enough room so that we did not need to remove the old plate.  We used the high-speed drill to drill away some  anterior spondylosis at the disc spaces so that  the plate lay down flat. We selected the appropriate length titanium anterior cervical plate. We laid it along the anterior aspect of the vertebral bodies from C3 and C4. We then drilled 14 mm holes at C3 and C4. We then secured the plate to the vertebral bodies by placing two 14 mm self-tapping screws at C3 and C4. We then obtained intraoperative radiograph. The demonstrating good position of the instrumentation. We therefore secured the screws the plate the locking each cam. This completed the instrumentation.  We then obtained hemostasis using bipolar electrocautery. We irrigated the wound out with bacitracin solution. We then removed the retractor. We inspected the esophagus for any damage. There was none apparent. We then reapproximated patient's platysmal muscle with interrupted 3-0 Vicryl suture. We then reapproximated the subcutaneous tissue with interrupted 3-0 Vicryl suture. The skin was reapproximated with Steri-Strips and benzoin. The wound was then covered with bacitracin ointment. A sterile dressing was applied. The drapes were removed. Patient was subsequently extubated by the anesthesia team and transported to the post anesthesia care unit in stable condition. All sponge instrument and needle counts were reportedly correct at the end of this case.

## 2021-01-20 NOTE — Anesthesia Postprocedure Evaluation (Signed)
Anesthesia Post Note  Patient: ALAM GUTERREZ  Procedure(s) Performed: Cervical three-four Anterior cervical decompression/discectomy/fusion/interbody prosthesis/plate/screws (N/A Neck)     Patient location during evaluation: PACU Anesthesia Type: General Level of consciousness: awake and alert, patient cooperative and oriented Pain management: pain level controlled Vital Signs Assessment: post-procedure vital signs reviewed and stable Respiratory status: spontaneous breathing, nonlabored ventilation and respiratory function stable Cardiovascular status: blood pressure returned to baseline and stable Postop Assessment: no apparent nausea or vomiting, adequate PO intake and able to ambulate Anesthetic complications: no   No complications documented.  Last Vitals:  Vitals:   01/20/21 1145 01/20/21 1200  BP: 120/67 (!) 104/56  Pulse: 94 95  Resp: 16 20  Temp:  36.6 C  SpO2: 93% 94%    Last Pain:  Vitals:   01/20/21 1200  TempSrc:   PainSc: 4     LLE Motor Response: Purposeful movement (01/20/21 1200) LLE Sensation: Full sensation (01/20/21 1200) RLE Motor Response: Purposeful movement (01/20/21 1200) RLE Sensation: Full sensation (01/20/21 1200)      Vinicio Lynk,E. Cherolyn Behrle

## 2021-01-20 NOTE — Transfer of Care (Signed)
Immediate Anesthesia Transfer of Care Note  Patient: Raymond [name]  Procedure(s) Performed: Cervical three-four Anterior cervical decompression/discectomy/fusion/interbody prosthesis/plate/screws (N/A Neck)  Patient Location: PACU  Anesthesia Type:General  Level of Consciousness: awake, alert  and oriented  Airway & Oxygen Therapy: Patient Spontanous Breathing  Post-op Assessment: Report given to RN and Post -op Vital signs reviewed and stable  Post vital signs: Reviewed and stable  Last Vitals:  Vitals Value Taken Time  BP 134/80 01/20/21 1100  Temp    Pulse 92 01/20/21 1104  Resp 24 01/20/21 1104  SpO2 95 % 01/20/21 1104  Vitals shown include unvalidated device data.  Last Pain:  Vitals:   01/20/21 0723  TempSrc:   PainSc: 6          Complications: No complications documented.

## 2021-01-20 NOTE — Anesthesia Procedure Notes (Signed)
Procedure Name: Intubation Date/Time: 01/20/2021 8:47 AM Performed by: Macie Burows, CRNA Pre-anesthesia Checklist: Patient identified, Emergency Drugs available, Suction available and Patient being monitored Patient Re-evaluated:Patient Re-evaluated prior to induction Oxygen Delivery Method: Circle system utilized Preoxygenation: Pre-oxygenation with 100% oxygen Induction Type: IV induction Ventilation: Oral airway inserted - appropriate to patient size and Two handed mask ventilation required Laryngoscope Size: Glidescope and 4 Grade View: Grade I Tube type: Oral Tube size: 7.5 mm Number of attempts: 1 Airway Equipment and Method: Video-laryngoscopy and Rigid stylet Placement Confirmation: ETT inserted through vocal cords under direct vision,  positive ETCO2 and breath sounds checked- equal and bilateral Secured at: 25 cm Tube secured with: Tape Dental Injury: Teeth and Oropharynx as per pre-operative assessment

## 2021-01-21 ENCOUNTER — Encounter (HOSPITAL_COMMUNITY): Payer: Self-pay | Admitting: Neurosurgery

## 2021-01-21 DIAGNOSIS — M4712 Other spondylosis with myelopathy, cervical region: Secondary | ICD-10-CM | POA: Diagnosis not present

## 2021-01-21 LAB — GLUCOSE, CAPILLARY: Glucose-Capillary: 191 mg/dL — ABNORMAL HIGH (ref 70–99)

## 2021-01-21 MED ORDER — CYCLOBENZAPRINE HCL 10 MG PO TABS: 10.0000 mg | ORAL_TABLET | Freq: Three times a day (TID) | ORAL | 0 refills | Status: DC | PRN

## 2021-01-21 MED ORDER — OXYCODONE HCL 10 MG PO TABS: 10.0000 mg | ORAL_TABLET | ORAL | 0 refills | Status: DC | PRN

## 2021-01-21 MED ORDER — DOCUSATE SODIUM 100 MG PO CAPS: 100.0000 mg | ORAL_CAPSULE | Freq: Two times a day (BID) | ORAL | 0 refills | Status: DC

## 2021-01-21 NOTE — Discharge Summary (Signed)
Physician Discharge Summary     Providing Compassionate, Quality Care - Together   Patient ID: Raymond [name] MRN: [id number] DOB/AGE: [birth year] 62 y.o.  Admit date: 01/20/2021 Discharge date: 01/21/2021  Admission Diagnoses: Cervical spondylosis with myelopathy and radiculopathy  Discharge Diagnoses:  Active Problems:   Cervical spondylosis with myelopathy and radiculopathy   Discharged Condition: good  Hospital Course: Patient underwent a C3-4 ACDF  by Dr. Lovell Sheehan on 01/20/2021. He was admitted to 3C03  following recovery from anesthesia in the PACU. His postoperative course has been uncomplicated. He has worked with both physical and occupational therapies who feel the patient is ready for discharge home. He is ambulating independently and without difficulty. He is tolerating a normal diet. He is not having any bowel or bladder dysfunction. His pain is well-controlled with oral pain medication. He is ready for discharge to home.   Consults: rehabilitation medicine  Significant Diagnostic Studies: radiology: DG Cervical Spine 1 View  Result Date: 01/20/2021 CLINICAL DATA:  Anterior fusion EXAM: DG CERVICAL SPINE - 1 VIEW COMPARISON:  Cervical MRI July 09, 2020; cervical radiograph Jan 24, 2017 FINDINGS: Cross-table lateral image obtained, time stamped 10:34:01. There is new anterior screw and plate fixation at C3 and C4. Prior anterior fusion from C4-C7 remains in place with the lower cervical spine not well seen due to overlying shoulder artifact. No fracture or spondylolisthesis evident. Prevertebral soft tissues and predental space regions are normal. Patient intubated. IMPRESSION: New anterior screw and plate fixation from C3-C4 with support hardware intact on lateral view. Anterior fusion previously placed more inferiorly appears grossly intact with limitation of imaging due to shoulder artifact. No fracture or spondylolisthesis evident. Electronically Signed   By: Bretta Bang III M.D.   On: 01/20/2021 13:21      Treatments: surgery: C3-4 anterior cervical discectomy/decompression; C3-4 interbody arthrodesis with local morcellized autograft bone and Zimmer DBM; insertion of interbody prosthesis at C3-4 (Zimmer peek interbody prosthesis); anterior cervical plating from C3-4 with globus titanium plate  Discharge Exam: Blood pressure (!) 151/85, pulse (!) 105, temperature 98.2 F (36.8 C), temperature source Oral, resp. rate 18, height 6\' 2"  (1.88 m), weight 122.5 kg, SpO2 95 %.   Alert and oriented x 4 PERRLA CN II-XII grossly intact MAE, Strength and sensation intact Incision is covered with Honeycomb dressing and Steri Strips; Dressing is clean, dry, and intact   Disposition: Discharge disposition: 01-Home or Self Care        Allergies as of 01/21/2021      Reactions   Irbesartan Other (See Comments)   Sun sensitivity/skin burning      Medication List    STOP taking these medications   HYDROcodone-acetaminophen 10-325 MG tablet Commonly known as: NORCO   methocarbamol 500 MG tablet Commonly known as: ROBAXIN   traMADol 50 MG tablet Commonly known as: ULTRAM     TAKE these medications   albuterol 108 (90 Base) MCG/ACT inhaler Commonly known as: VENTOLIN HFA Inhale 2 puffs into the lungs every 4 (four) hours as needed for wheezing or shortness of breath.   amLODipine 10 MG tablet Commonly known as: NORVASC Take 10 mg by mouth daily.   atorvastatin 10 MG tablet Commonly known as: LIPITOR Take 10 mg by mouth at bedtime.   cyclobenzaprine 10 MG tablet Commonly known as: FLEXERIL Take 1 tablet (10 mg total) by mouth 3 (three) times daily as needed for muscle spasms.   docusate sodium 100 MG capsule Commonly known as: COLACE Take 1  capsule (100 mg total) by mouth 2 (two) times daily.   fluticasone 50 MCG/ACT nasal spray Commonly known as: FLONASE Place 1 spray into both nostrils daily as needed for allergies or  rhinitis.   hydrocortisone valerate cream 0.2 % Commonly known as: WESTCORT Apply 1 application topically 2 (two) times daily as needed (skin irritation/rash.).   lisinopril-hydrochlorothiazide 20-25 MG tablet Commonly known as: ZESTORETIC Take 1 tablet by mouth daily.   metFORMIN 500 MG tablet Commonly known as: GLUCOPHAGE Take 500 mg by mouth 2 (two) times daily with a meal.   montelukast 10 MG tablet Commonly known as: SINGULAIR Take 10 mg by mouth at bedtime.   OASIS TEARS PF OP Place 1 drop into both eyes 2 (two) times daily as needed (dry/irritated eyes).   omeprazole 40 MG capsule Commonly known as: PRILOSEC Take 40 mg by mouth daily before breakfast.   Oxycodone HCl 10 MG Tabs Take 1 tablet (10 mg total) by mouth every 4 (four) hours as needed for severe pain ((score 7 to 10)). What changed:   medication strength  how much to take  when to take this  reasons to take this   sucralfate 1 g tablet Commonly known as: CARAFATE Take 1 g by mouth 4 (four) times daily as needed (acid reflux).   Trelegy Ellipta 100-62.5-25 MCG/INH Aepb Generic drug: Fluticasone-Umeclidin-Vilant Inhale 1 puff into the lungs daily.   zolpidem 10 MG tablet Commonly known as: AMBIEN Take 10 mg by mouth at bedtime as needed for sleep.       Follow-up Information    Tressie Stalker, MD. Schedule an appointment as soon as possible for a visit in 3 week(s).   Specialty: Neurosurgery Contact information: [address] [place] [address] [phone]               Signed: Val Eagle, DNP, AGNP-C Nurse Practitioner  [facility] [address], [address], [place], [place] [address] P: [phone]    F: [phone]  01/21/2021, 8:39 AM

## 2021-01-21 NOTE — Evaluation (Signed)
Occupational Therapy Evaluation/Discharge Patient Details Name: Raymond [name] MRN: [id number] DOB: Jan 29, 1956 Today's Date: 01/21/2021    History of Present Illness Pt is a 65 y/o male with recurrent neck pain radiating into B shoulders. 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 Pt elected to undergo C3-4 ACDF. PMH: C4-5 and C5-6 anterior cervicectomy, COPD, DM II, HTN, sleep apnea, L TKA.   Clinical Impression   PTA, pt lives with spouse and reports Independence in all daily tasks without AD, including yard work on farm property. Pt presents now s/p surgery with controlled pain levels. Educated pt on cervical precautions for ADLs/IADLs with pt verbalizing understanding of all education due to previous cervical operation. Pt does require frequent cues to maintain cervical precautions, especially to avoid bending. Pt overall Independent for UB ADLs, mobility and management of cervical brace. However, pt does require Min A for LB ADLs due to decreased flexibility from previous TKA. Encouraged use of shower chair at home and use of reacher to avoid bending to pick items from floor. Pt reports wife can assist with LB dressing and IADLs while recovering. Pt declined stair training and reports confident he will be able to manage these well at home. No further skilled OT services at acute level or on DC. OT to sign off.    Follow Up Recommendations  No OT follow up    Equipment Recommendations  None recommended by OT    Recommendations for Other Services       Precautions / Restrictions Precautions Precautions: Fall;Cervical Precaution Booklet Issued: Yes (comment) Required Braces or Orthoses: Cervical Brace Cervical Brace: Hard collar;Other (comment) (off for bathing, dressing. can ambulate to bathroom without it) Restrictions Weight Bearing Restrictions: No      Mobility Bed Mobility               General bed mobility comments: sitting EOB on entry    Transfers Overall  transfer level: Independent Equipment used: None                  Balance Overall balance assessment: Independent                                         ADL either performed or assessed with clinical judgement   ADL Overall ADL's : Needs assistance/impaired Eating/Feeding: Independent   Grooming: Independent;Standing;Oral care Grooming Details (indicate cue type and reason): cues for cervical precautions and to use cup for rinsing/spitting Upper Body Bathing: Independent;Sitting   Lower Body Bathing: Modified independent;Sit to/from stand   Upper Body Dressing : Independent;Sitting   Lower Body Dressing: Minimal assistance;Sit to/from stand Lower Body Dressing Details (indicate cue type and reason): Assist to don L sock due to hx of TKA with decreased flexibility. Cues and education on safe strategies to maintain cervical precautions due to tendency to bend Toilet Transfer: Independent;Ambulation   Toileting- Clothing Manipulation and Hygiene: Independent;Sit to/from stand       Functional mobility during ADLs: Independent General ADL Comments: Most difficulty noted with LB dressing due to decreased flexibility and cues needed to avoid bending to complete task     Vision Baseline Vision/History: Wears glasses Patient Visual Report: No change from baseline Vision Assessment?: No apparent visual deficits     Perception     Praxis      Pertinent Vitals/Pain Pain Assessment: Faces Faces Pain Scale: No hurt Pain Intervention(s): Monitored during session;Other (  comment) (reports medication helping with pain)     Hand Dominance Right   Extremity/Trunk Assessment Upper Extremity Assessment Upper Extremity Assessment: Overall WFL for tasks assessed   Lower Extremity Assessment Lower Extremity Assessment: Overall WFL for tasks assessed;LLE deficits/detail LLE Deficits / Details: hx of L TKA   Cervical / Trunk Assessment Cervical / Trunk  Assessment: Normal   Communication Communication Communication: No difficulties   Cognition Arousal/Alertness: Awake/alert Behavior During Therapy: WFL for tasks assessed/performed Overall Cognitive Status: Within Functional Limits for tasks assessed Area of Impairment: Memory                     Memory: Decreased recall of precautions         General Comments: A&Ox4. Does require frequent cues to avoid bending and excessive cervical ROM. Cues for slow pacing   General Comments  Pt with questions on when he can return to normal activities, as well as when he can get in pool at home - deferred these questions to MD    Exercises     Shoulder Instructions      Home Living Family/patient expects to be discharged to:: Private residence Living Arrangements: Spouse/significant other Available Help at Discharge: Family;Available 24 hours/day Type of Home: House Home Access: Stairs to enter [employer] of Steps: 5 through garage, 2-3 in front Entrance Stairs-Rails: Right;Left;Can reach both Home Layout: One level     Bathroom Shower/Tub: Producer, television/film/video: Handicapped height     Home Equipment: Environmental consultant - 2 wheels;Cane - single point;Bedside commode;Shower seat - built in;Hand held shower head;Adaptive equipment Adaptive Equipment: Reacher        Prior Functioning/Environment Level of Independence: Independent        Comments: Independent with ADLs, IADLs and mobility without AD. 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        OT Problem List:        OT Treatment/Interventions:      OT Goals(Current goals can be found in the care plan section) Acute Rehab OT Goals Patient Stated Goal: resume normal activities, get back in pool at home OT Goal Formulation: All assessment and education complete, DC therapy  OT Frequency:     Barriers to D/C:            Co-evaluation              AM-PAC OT "6 Clicks" Daily Activity      Outcome Measure Help from another person eating meals?: None Help from another person taking care of personal grooming?: None Help from another person toileting, which includes using toliet, bedpan, or urinal?: None Help from another person bathing (including washing, rinsing, drying)?: None Help from another person to put on and taking off regular upper body clothing?: None Help from another person to put on and taking off regular lower body clothing?: A Little 6 Click Score: 23   End of Session Equipment Utilized During Treatment: Cervical collar Nurse Communication: Mobility status  Activity Tolerance: Patient tolerated treatment well Patient left: in chair;with call bell/phone within reach  OT Visit Diagnosis: Pain Pain - part of body:  (neck)                Time: 6440-3474 OT Time Calculation (min): 20 min Charges:  OT General Charges $OT Visit: 1 Visit OT Evaluation $OT Eval Low Complexity: 1 Low  Bradd Canary, OTR/L Acute Rehab Services Office: [phone]  Lorre Munroe 01/21/2021, 7:47 AM

## 2021-01-21 NOTE — Discharge Instructions (Addendum)
Wound Care Keep incision covered and dry for three days.    Do not put any creams, lotions, or ointments on incision. Leave steri-strips on back.  They will fall off by themselves.  Activity Walk each and every day, increasing distance each day. No lifting greater than 5 lbs.  Avoid excessive neck motion. No driving for 2 weeks; may ride as a passenger locally.  Diet Resume your normal diet.  Call Your Doctor If Any of These Occur Redness, drainage, or swelling at the wound.  Temperature greater than 101 degrees. Severe pain not relieved by pain medication. Incision starts to come apart.  Follow Up Appt Call today for appointment in 1-2 weeks [phone]) or for problems.  If you have any hardware placed in your spine, you will need an x-ray before your appointment.

## 2021-05-27 ENCOUNTER — Other Ambulatory Visit: Payer: Self-pay | Admitting: Internal Medicine

## 2021-05-27 ENCOUNTER — Other Ambulatory Visit (HOSPITAL_BASED_OUTPATIENT_CLINIC_OR_DEPARTMENT_OTHER): Payer: Self-pay | Admitting: Internal Medicine

## 2021-05-27 DIAGNOSIS — I10 Essential (primary) hypertension: Secondary | ICD-10-CM

## 2021-05-27 DIAGNOSIS — R911 Solitary pulmonary nodule: Secondary | ICD-10-CM

## 2021-06-18 ENCOUNTER — Ambulatory Visit
Admission: RE | Admit: 2021-06-18 | Discharge: 2021-06-18 | Disposition: A | Discharge status: Home / Self Care | Source: Ambulatory Visit | Attending: Internal Medicine | Admitting: Internal Medicine

## 2021-06-18 ENCOUNTER — Other Ambulatory Visit: Payer: Self-pay

## 2021-06-18 DIAGNOSIS — R911 Solitary pulmonary nodule: Secondary | ICD-10-CM | POA: Insufficient documentation

## 2021-06-18 DIAGNOSIS — I10 Essential (primary) hypertension: Secondary | ICD-10-CM | POA: Diagnosis present

## 2021-06-18 MED ORDER — IOHEXOL 350 MG/ML SOLN
75.0000 mL | Freq: Once | INTRAVENOUS | Status: AC | PRN
  Administered 2021-06-18: 75 mL via INTRAVENOUS

## 2022-04-28 IMAGING — CT CT CHEST W/ CM
2 of 4 series · 15 of 36 positions shown, 18 images · IV contrast (omnipaque)
Comparison: [DATE] [DATE], [DATE].  [DATE] [DATE], [DATE].

CLINICAL DATA: Lung nodule.

EXAM:
CT CHEST WITH CONTRAST
TECHNIQUE: Multidetector CT imaging of the chest was performed during
intravenous contrast administration.
CONTRAST:  75mL OMNIPAQUE IOHEXOL 350 MG/ML SOLN

[Series 2: axial chest 2.00 · axial · 0.77mm/px · z∈[-1185,-909]mm · 12 of 164 slices shown, 15 images]
[im 13/164  mediastinal]
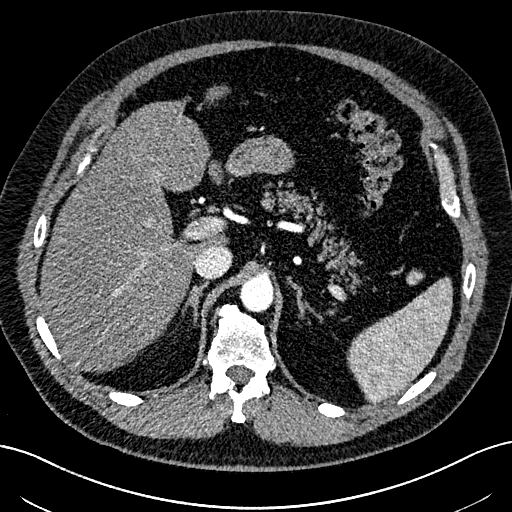
[im 13/164  lung]
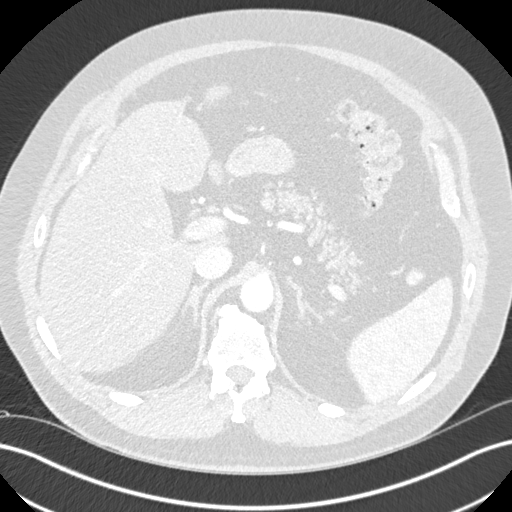
[im 26/164  lung]
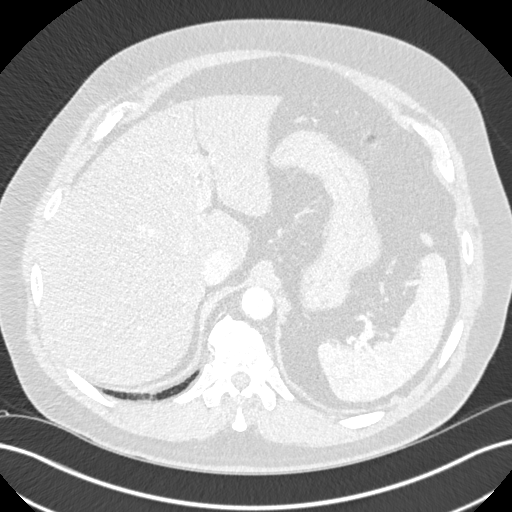
[im 38/164  lung]
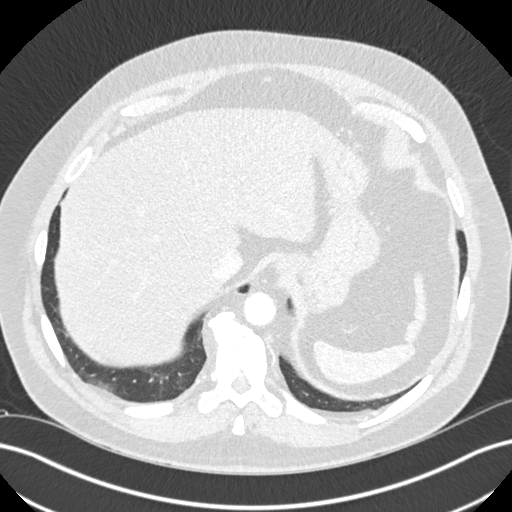
[im 51/164  lung]
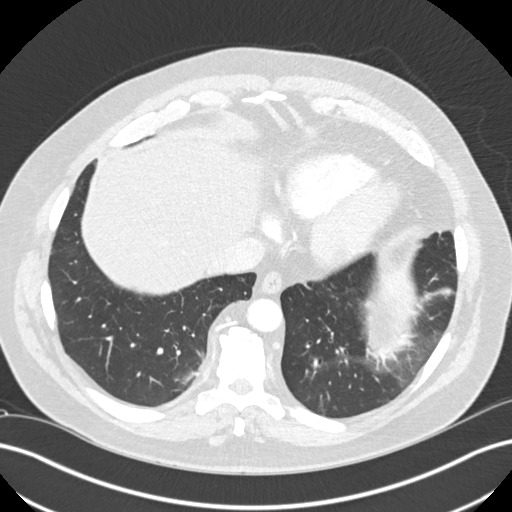
[im 63/164  mediastinal]
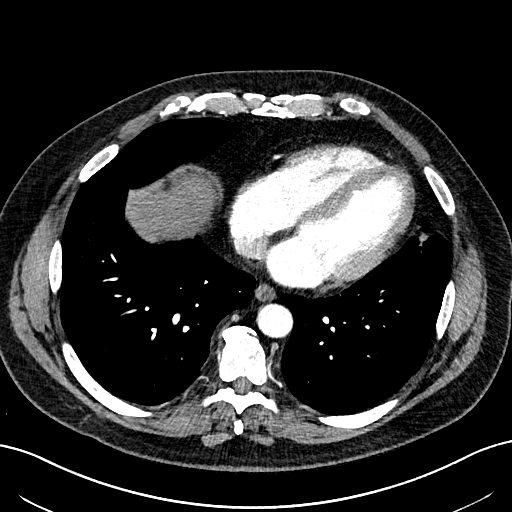
[im 63/164  lung]
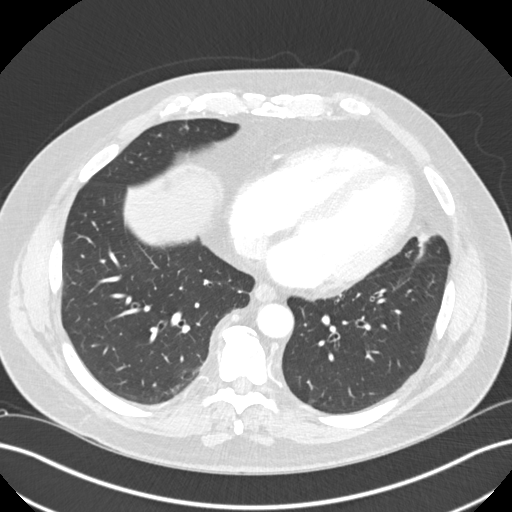
[im 76/164  lung]
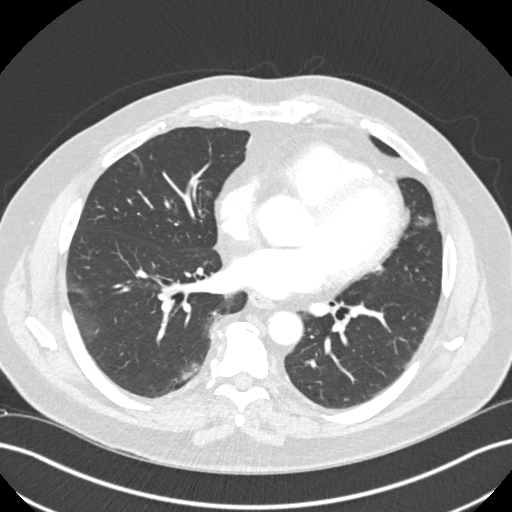
[im 88/164  lung]
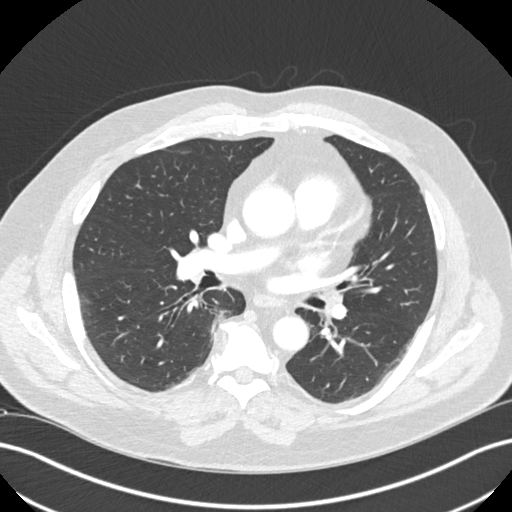
[im 101/164  lung]
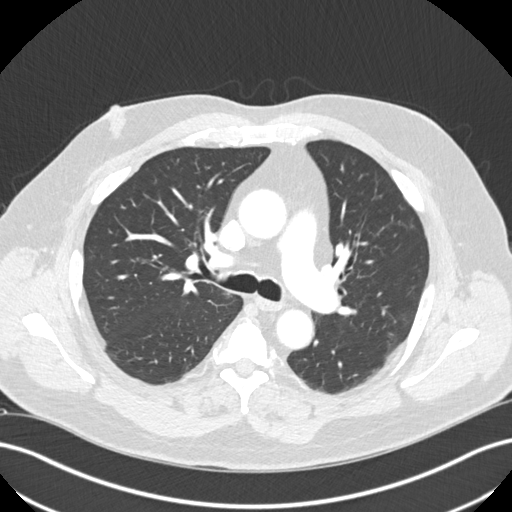
[im 113/164  mediastinal]
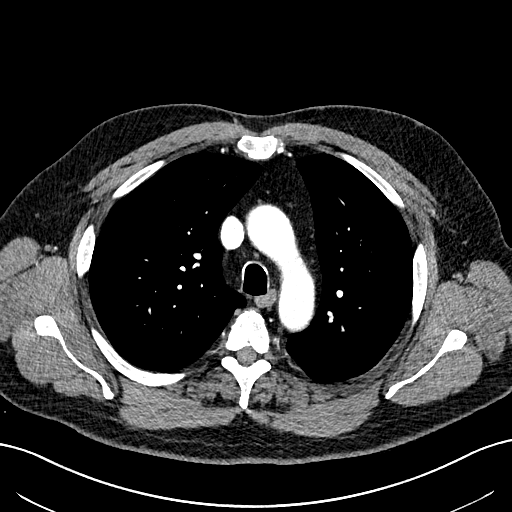
[im 113/164  lung]
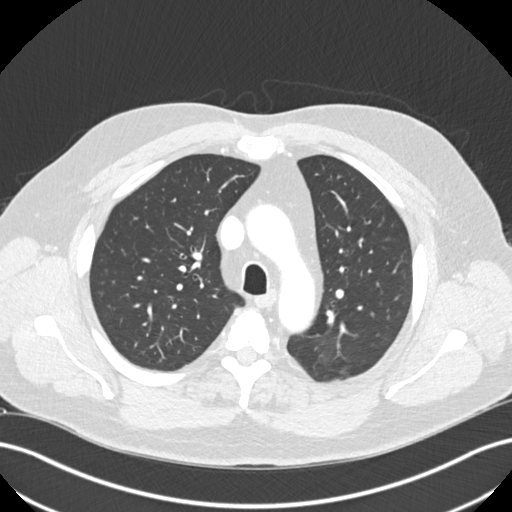
[im 126/164  lung]
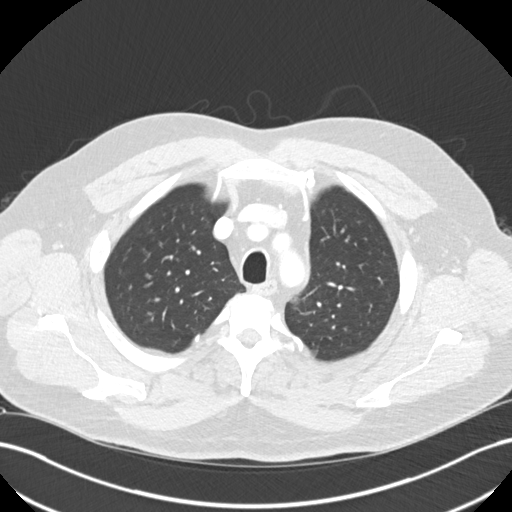
[im 138/164  lung]
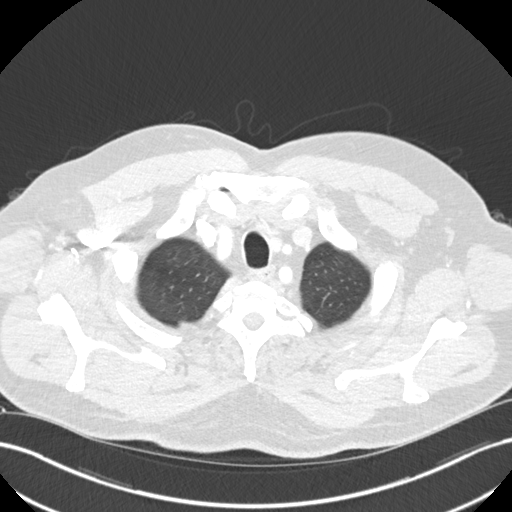
[im 151/164  lung]
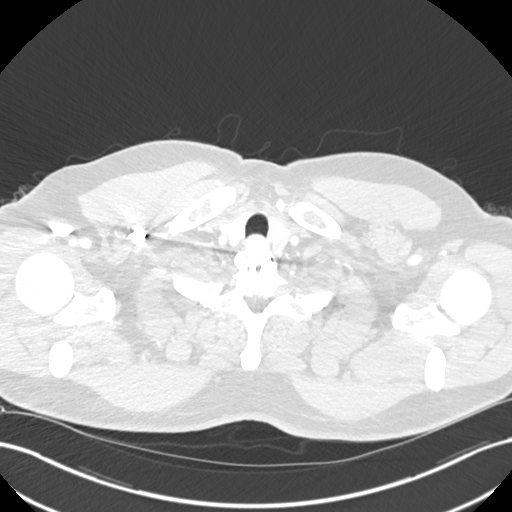

[Series 4: coronal chest 2.00 cor · coronal · 0.64mm/px · 3 of 166 slices shown]
[im 34/166  lung]
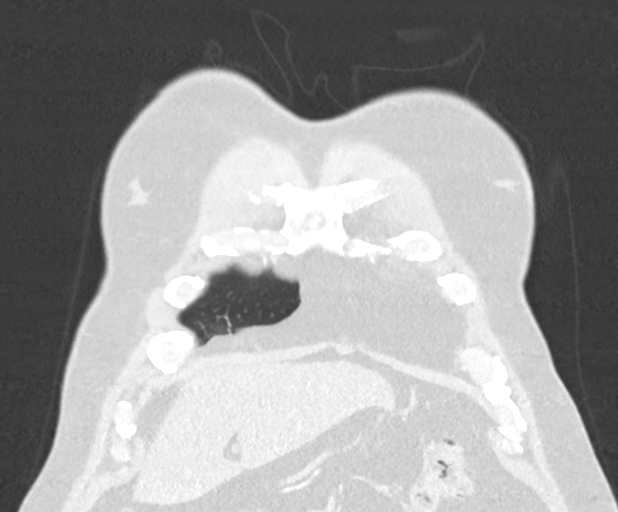
[im 67/166  lung]
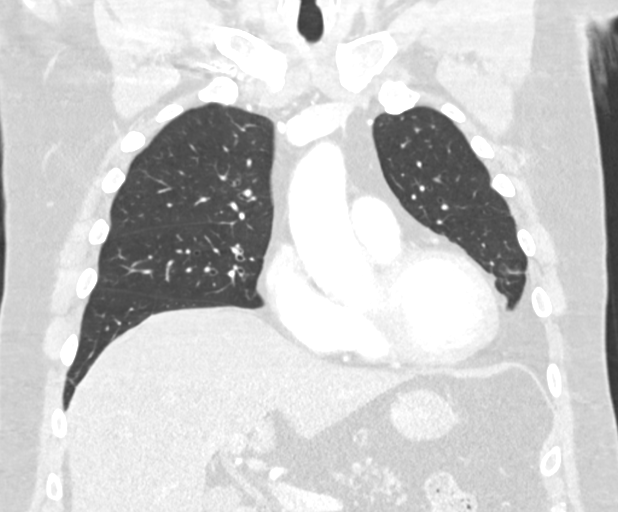
[im 100/166  lung]
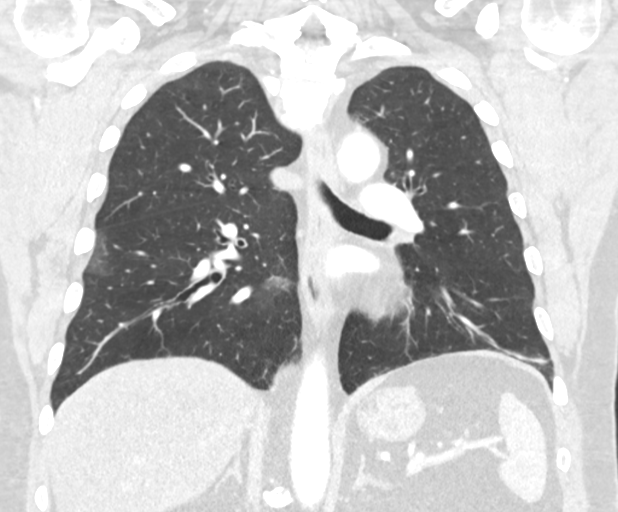

[15 of 36 positions shown; findings below may reference images not displayed]

FINDINGS: Cardiovascular: Atherosclerosis of thoracic aorta is noted without
aneurysm or dissection. Normal cardiac size. No pericardial
effusion.

Mediastinum/Nodes: No enlarged mediastinal, hilar, or axillary lymph
nodes. Thyroid gland, trachea, and esophagus demonstrate no
significant findings.

Lungs/Pleura: No pneumothorax or pleural effusion is noted. Stable
mild bilateral posterior basilar subsegmental atelectasis or
scarring is noted, left greater than right. Stable probable scarring
is seen involving the lingular segment of the left upper lobe as
well as the right middle lobe. Grossly stable 7 mm nodule is noted
posteriorly in the right lower lobe best seen on image number 83 of
series 3.

Upper Abdomen: Hepatic steatosis.

Musculoskeletal: No chest wall abnormality. No acute or significant
osseous findings.
IMPRESSION: Grossly stable 7 mm nodule seen in right lower lobe. This can be
considered benign at this point with no further follow-up required.

Hepatic steatosis.

Aortic Atherosclerosis (AUO8V-J62.2).

## 2022-12-26 ENCOUNTER — Other Ambulatory Visit: Payer: Self-pay | Admitting: Internal Medicine

## 2022-12-26 DIAGNOSIS — Z0001 Encounter for general adult medical examination with abnormal findings: Secondary | ICD-10-CM

## 2022-12-26 DIAGNOSIS — Z77090 Contact with and (suspected) exposure to asbestos: Secondary | ICD-10-CM

## 2023-01-02 ENCOUNTER — Ambulatory Visit
Admission: RE | Admit: 2023-01-02 | Discharge: 2023-01-02 | Disposition: A | Discharge status: Home / Self Care | Source: Ambulatory Visit | Attending: Internal Medicine | Admitting: Internal Medicine

## 2023-01-02 DIAGNOSIS — Z0001 Encounter for general adult medical examination with abnormal findings: Secondary | ICD-10-CM

## 2023-01-02 DIAGNOSIS — Z77090 Contact with and (suspected) exposure to asbestos: Secondary | ICD-10-CM

## 2023-01-02 MED ORDER — IOHEXOL 300 MG/ML  SOLN
75.0000 mL | Freq: Once | INTRAMUSCULAR | Status: AC | PRN
  Administered 2023-01-02: 75 mL via INTRAVENOUS

## 2023-02-08 ENCOUNTER — Other Ambulatory Visit: Payer: Self-pay | Admitting: Pulmonary Disease

## 2023-02-08 DIAGNOSIS — R0602 Shortness of breath: Secondary | ICD-10-CM

## 2023-02-08 DIAGNOSIS — R9389 Abnormal findings on diagnostic imaging of other specified body structures: Secondary | ICD-10-CM

## 2023-02-13 ENCOUNTER — Ambulatory Visit
Admission: RE | Admit: 2023-02-13 | Discharge: 2023-02-13 | Disposition: A | Discharge status: Home / Self Care | Source: Ambulatory Visit | Attending: Pulmonary Disease | Admitting: Pulmonary Disease

## 2023-02-13 DIAGNOSIS — R9389 Abnormal findings on diagnostic imaging of other specified body structures: Secondary | ICD-10-CM | POA: Insufficient documentation

## 2023-02-13 DIAGNOSIS — R0602 Shortness of breath: Secondary | ICD-10-CM | POA: Diagnosis present

## 2023-02-16 ENCOUNTER — Ambulatory Visit
Admission: RE | Admit: 2023-02-16 | Discharge: 2023-02-16 | Disposition: A | Discharge status: Home / Self Care | Payer: Self-pay | Source: Ambulatory Visit | Attending: Pulmonary Disease | Admitting: Pulmonary Disease

## 2023-02-16 DIAGNOSIS — R0602 Shortness of breath: Secondary | ICD-10-CM | POA: Insufficient documentation

## 2023-03-30 ENCOUNTER — Other Ambulatory Visit (HOSPITAL_COMMUNITY): Payer: Self-pay | Admitting: Nephrology

## 2023-03-30 DIAGNOSIS — E1122 Type 2 diabetes mellitus with diabetic chronic kidney disease: Secondary | ICD-10-CM

## 2023-03-30 DIAGNOSIS — E785 Hyperlipidemia, unspecified: Secondary | ICD-10-CM

## 2023-03-30 DIAGNOSIS — I10 Essential (primary) hypertension: Secondary | ICD-10-CM

## 2023-03-30 DIAGNOSIS — E668 Other obesity: Secondary | ICD-10-CM

## 2023-03-30 DIAGNOSIS — R809 Proteinuria, unspecified: Secondary | ICD-10-CM

## 2023-03-30 DIAGNOSIS — N2889 Other specified disorders of kidney and ureter: Secondary | ICD-10-CM

## 2023-04-07 ENCOUNTER — Ambulatory Visit
Admission: RE | Admit: 2023-04-07 | Discharge: 2023-04-07 | Disposition: A | Discharge status: Home / Self Care | Source: Ambulatory Visit | Attending: Nephrology | Admitting: Nephrology

## 2023-04-07 DIAGNOSIS — E1122 Type 2 diabetes mellitus with diabetic chronic kidney disease: Secondary | ICD-10-CM | POA: Diagnosis present

## 2023-04-07 DIAGNOSIS — R809 Proteinuria, unspecified: Secondary | ICD-10-CM | POA: Insufficient documentation

## 2023-04-07 DIAGNOSIS — E668 Other obesity: Secondary | ICD-10-CM | POA: Diagnosis not present

## 2023-04-07 DIAGNOSIS — I10 Essential (primary) hypertension: Secondary | ICD-10-CM | POA: Diagnosis present

## 2023-04-07 DIAGNOSIS — E785 Hyperlipidemia, unspecified: Secondary | ICD-10-CM | POA: Diagnosis present

## 2023-04-07 DIAGNOSIS — N2889 Other specified disorders of kidney and ureter: Secondary | ICD-10-CM | POA: Diagnosis present

## 2023-04-07 MED ORDER — IOHEXOL 300 MG/ML  SOLN
100.0000 mL | Freq: Once | INTRAMUSCULAR | Status: AC | PRN
  Administered 2023-04-07: 100 mL via INTRAVENOUS

## 2024-02-09 ENCOUNTER — Ambulatory Visit: Payer: Self-pay

## 2024-02-09 DIAGNOSIS — K640 First degree hemorrhoids: Secondary | ICD-10-CM

## 2024-02-09 DIAGNOSIS — K219 Gastro-esophageal reflux disease without esophagitis: Secondary | ICD-10-CM | POA: Diagnosis not present

## 2024-02-09 DIAGNOSIS — R1319 Other dysphagia: Secondary | ICD-10-CM | POA: Diagnosis not present

## 2024-02-09 DIAGNOSIS — R1310 Dysphagia, unspecified: Secondary | ICD-10-CM | POA: Diagnosis not present

## 2024-02-09 DIAGNOSIS — Z860101 Personal history of adenomatous and serrated colon polyps: Secondary | ICD-10-CM | POA: Diagnosis not present

## 2024-02-09 DIAGNOSIS — K5730 Diverticulosis of large intestine without perforation or abscess without bleeding: Secondary | ICD-10-CM | POA: Diagnosis not present

## 2024-02-09 DIAGNOSIS — Z8719 Personal history of other diseases of the digestive system: Secondary | ICD-10-CM | POA: Diagnosis not present

## 2024-02-09 DIAGNOSIS — K635 Polyp of colon: Secondary | ICD-10-CM | POA: Diagnosis not present

## 2024-02-09 DIAGNOSIS — Z09 Encounter for follow-up examination after completed treatment for conditions other than malignant neoplasm: Secondary | ICD-10-CM | POA: Diagnosis not present

## 2024-02-09 DIAGNOSIS — K222 Esophageal obstruction: Secondary | ICD-10-CM

## 2024-05-14 ENCOUNTER — Other Ambulatory Visit: Payer: Self-pay | Admitting: Physician Assistant

## 2024-05-14 ENCOUNTER — Ambulatory Visit
Admission: RE | Admit: 2024-05-14 | Discharge: 2024-05-14 | Disposition: A | Discharge status: Home / Self Care | Source: Ambulatory Visit | Attending: Physician Assistant | Admitting: Physician Assistant

## 2024-05-14 DIAGNOSIS — L03116 Cellulitis of left lower limb: Secondary | ICD-10-CM | POA: Insufficient documentation

## 2024-06-11 ENCOUNTER — Encounter (INDEPENDENT_AMBULATORY_CARE_PROVIDER_SITE_OTHER): Payer: Self-pay | Admitting: Vascular Surgery

## 2024-06-11 ENCOUNTER — Ambulatory Visit (INDEPENDENT_AMBULATORY_CARE_PROVIDER_SITE_OTHER): Payer: Self-pay | Admitting: Vascular Surgery

## 2024-06-11 VITALS — BP 161/90 | HR 109 | Resp 18 | Wt 254.0 lb

## 2024-06-11 DIAGNOSIS — E119 Type 2 diabetes mellitus without complications: Secondary | ICD-10-CM | POA: Diagnosis not present

## 2024-06-11 DIAGNOSIS — L03116 Cellulitis of left lower limb: Secondary | ICD-10-CM

## 2024-06-11 DIAGNOSIS — I10 Essential (primary) hypertension: Secondary | ICD-10-CM | POA: Diagnosis not present

## 2024-06-11 DIAGNOSIS — E782 Mixed hyperlipidemia: Secondary | ICD-10-CM | POA: Diagnosis not present

## 2024-06-11 NOTE — Progress Notes (Signed)
 Subjective:    Patient ID: Raymond [name], male    DOB: Oct 15, 1955, 68 y.o.   MRN: [id number] Chief Complaint  Patient presents with   New Patient (Initial Visit)    Ref Tumey consult left leg swelling and left le celluitis    Raymond [name] is a 68 yo male who presents to clinic today with a chief complaint of left lower leg swelling after a bout of left lower extremity cellulitis.  Patient states about 4 to 6 weeks ago he developed cellulitis to his left lower extremity.  He had a prior knee replacement a couple years prior to that that had failed and had to be redone.  He denies having chronic swelling but has noticed that when it did swell he gets going to bed at night wake up the next morning and the swelling will have resolved.  Patient was seen by orthopedics who had done his knee for the cellulitis.  He was treated with a round of antibiotics weeks ago.  He has not finished that.  His bilateral lower extremities look the same today.  There is some slight slight swelling to his left ankle but otherwise no sores or cellulitic infection to note.  His lower extremities look red but bilaterally red.  He is not having any pain to his calves.  He was checked at that time with an ultrasound of his lower extremity which was negative for any DVT's.  He follows up today to see if there is anything more to do.    Review of Systems  Constitutional: Negative.   Cardiovascular:  Positive for leg swelling.  Musculoskeletal:  Positive for arthralgias and myalgias.       Muscle and joint pain to his left lower extremity where he has had prior knee replacement  All other systems reviewed and are negative.      Objective:   Physical Exam Vitals reviewed.  Constitutional:      Appearance: Normal appearance. He is normal weight.  HENT:     Head: Normocephalic.  Eyes:     Pupils: Pupils are equal, round, and reactive to light.  Cardiovascular:     Rate and Rhythm: Normal rate and regular rhythm.      Pulses: Normal pulses.     Heart sounds: Normal heart sounds.  Pulmonary:     Effort: Pulmonary effort is normal.     Breath sounds: Normal breath sounds.  Abdominal:     General: Abdomen is flat. Bowel sounds are normal.     Palpations: Abdomen is soft.  Musculoskeletal:        General: Swelling present.     Comments: Very slight swelling to his left ankle.  Skin:    General: Skin is warm and dry.     Capillary Refill: Capillary refill takes 2 to 3 seconds.  Neurological:     General: No focal deficit present.     Mental Status: He is alert and oriented to person, place, and time. Mental status is at baseline.  Psychiatric:        Mood and Affect: Mood normal.        Behavior: Behavior normal.        Thought Content: Thought content normal.        Judgment: Judgment normal.     BP (!) 161/90   Pulse (!) 109   Resp 18   Wt 254 lb (115.2 kg)   BMI 32.61 kg/m   Past Medical History:  Diagnosis  Date   Arthritis    Asbestos exposure    Asthma    COPD (chronic obstructive pulmonary disease) (HCC)    Diabetes mellitus without complication (HCC)    type 2    GERD (gastroesophageal reflux disease)    Hypertension    Muscle spasms of neck    Seasonal allergies    Sleep apnea    cpap- does not know settings     Social History   Socioeconomic History   Marital status: Married    Spouse name: Not on file   Number of children: Not on file   Years of education: 14   Highest education level: Not on file  Occupational History   Not on file  Tobacco Use   Smoking status: Former    Current packs/day: 0.00    Average packs/day: 2.0 packs/day for 15.0 years (30.0 ttl pk-yrs)    Types: Cigarettes    Start date: 08/19/1974    Quit date: 08/19/1989    Years since quitting: 34.8   Smokeless tobacco: Never  Vaping Use   Vaping status: Never Used  Substance and Sexual Activity   Alcohol  use: Not Currently   Drug use: No   Sexual activity: Not on file  Other Topics  Concern   Not on file  Social History Narrative   Not on file   Social Drivers of Health   Financial Resource Strain: Low Risk  (01/31/2024)   Received from [facility]   Overall Financial Resource Strain (CARDIA)    Difficulty of Paying Living Expenses: Not hard at all  Food Insecurity: No Food Insecurity (01/31/2024)   Received from [facility]   Hunger Vital Sign    Within the past 12 months, you worried that your food would run out before you got the money to buy more.: Never true    Within the past 12 months, the food you bought just didn't last and you didn't have money to get more.: Never true  Transportation Needs: No Transportation Needs (01/31/2024)   Received from [facility] - Transportation    In the past 12 months, has lack of transportation kept you from medical appointments or from getting medications?: No    Lack of Transportation (Non-Medical): No  Physical Activity: Not on file  Stress: Not on file  Social Connections: Not on file  Intimate Partner Violence: Not on file    Past Surgical History:  Procedure Laterality Date   ANTERIOR CERVICAL DECOMP/DISCECTOMY FUSION N/A 01/20/2021   Procedure: Cervical three-four Anterior cervical decompression/discectomy/fusion/interbody prosthesis/plate/screws;  Surgeon: Mavis Purchase, MD;  Location: [facility] OR;  Service: Neurosurgery;  Laterality: N/A;   CARPAL TUNNEL RELEASE Bilateral    CERVICAL FUSION     HAND SURGERY Right    trigger finger release   KNEE ARTHROSCOPY Left 08/21/2013   Procedure: LEFT ARTHROSCOPY KNEE WITH DEBRIDEMENT AND CHONDROPLASTY;  Surgeon: Dempsey LULLA Moan, MD;  Location: [facility] ORS;  Service: Orthopedics;  Laterality: Left;   NASAL SINUS SURGERY     TOTAL KNEE ARTHROPLASTY Left 06/11/2018   Procedure: LEFT TOTAL KNEE ARTHROPLASTY;  Surgeon: Moan Dempsey, MD;  Location: [facility] OR[facility];  Service: Orthopedics;  Laterality: Left;   TOTAL KNEE REVISION  Left 06/17/2020   Procedure: Left total knee arthroplasty poly revision;  Surgeon: Moan Dempsey, MD;  Location: [facility] OR[facility];  Service: Orthopedics;  Laterality: Left;     History reviewed. No pertinent family history.  Allergies  Allergen Reactions  Irbesartan Other (See Comments)    Sun sensitivity/skin burning       Latest Ref Rng & Units 01/13/2021    9:30 AM 06/18/2020    3:55 AM 06/04/2020   11:04 AM  CBC  WBC 4.0 - 10.5 K/uL 7.7  16.0  8.9   Hemoglobin 13.0 - 17.0 g/dL 85.6  86.6  85.4   Hematocrit 39.0 - 52.0 % 43.4  39.8  44.3   Platelets 150 - 400 K/uL 352  272  328       CMP     Component Value Date/Time   NA 136 01/13/2021 0930   K 3.9 01/13/2021 0930   CL 103 01/13/2021 0930   CO2 23 01/13/2021 0930   GLUCOSE 167 (H) 01/13/2021 0930   BUN 19 01/13/2021 0930   CREATININE 1.07 01/13/2021 0930   CALCIUM  9.4 01/13/2021 0930   PROT 7.5 06/04/2020 1104   ALBUMIN 4.4 06/04/2020 1104   AST 26 06/04/2020 1104   ALT 29 06/04/2020 1104   ALKPHOS 49 06/04/2020 1104   BILITOT 0.8 06/04/2020 1104   GFRNONAA >60 01/13/2021 0930     No results found.     Assessment & Plan:   1. Cellulitis of left lower extremity (Primary) Patient with history of cellulitis to his left lower extremity 4 to 6 weeks ago.  Was treated with a round of antibiotics by orthopedics who had done his left knee replacement.  At that time he was referred to see vascular for possible issues related to left lower leg swelling.  Today he presents with no swelling to his leg.  Skin is intact clean and dry without any cellulitis to note.  Skin is warm but not hot to touch.  He does not have any open sores and there is no weeping or draining.  He does not have any pain on palpation to his calf.  He endorses that he uses compression socks since having his knee replaced and he has not really had any issues with continuous swelling.  At this time I do not see anything to treat.  He underwent a venous  ultrasound of his left lower extremity that was negative for DVT.  I do not see any signs or symptoms of cellulitis today.  It seems to have resolved.  He does have slight swelling to his left ankle today.  I recommend that he continue to conservative therapy measures such as compression socks which he is already doing.  Rest and elevation of his left lower extremity when he can.  Exercise on a daily basis to keep the blood flowing as well.  Patient to follow-up with vein and vascular surgery as needed if he has recurrent bilateral lower extremity swelling that does not resolve with conservative measures.  2. Primary hypertension Continue antihypertensive medications as already ordered, these medications have been reviewed and there are no changes at this time.  3. Type 2 diabetes mellitus without complication, without long-term current use of insulin  (HCC) Continue hypoglycemic medications as already ordered, these medications have been reviewed and there are no changes at this time.  Hgb A1C to be monitored as already arranged by primary service  4. Mixed hyperlipidemia Continue statin as ordered and reviewed, no changes at this time   Current Outpatient Medications on File Prior to Visit  Medication Sig Dispense Refill   albuterol  (PROVENTIL  HFA;VENTOLIN  HFA) 108 (90 Base) MCG/ACT inhaler Inhale 2 puffs into the lungs every 4 (four) hours as needed for wheezing or  shortness of breath.     amLODipine  (NORVASC ) 10 MG tablet Take 10 mg by mouth daily.     aspirin  EC 81 MG tablet Take 81 mg by mouth daily.     atorvastatin  (LIPITOR) 10 MG tablet Take 10 mg by mouth at bedtime.     cyclobenzaprine  (FLEXERIL ) 10 MG tablet Take 1 tablet (10 mg total) by mouth 3 (three) times daily as needed for muscle spasms. 30 tablet 0   docusate sodium  (COLACE) 100 MG capsule Take 1 capsule (100 mg total) by mouth 2 (two) times daily. 10 capsule 0   fluticasone  (FLONASE ) 50 MCG/ACT nasal spray Place 1 spray  into both nostrils daily as needed for allergies or rhinitis.     HYDROcodone -acetaminophen  (NORCO) 10-325 MG tablet Take 1 tablet by mouth every 4 (four) hours as needed.     hydrocortisone valerate cream (WESTCORT) 0.2 % Apply 1 application topically 2 (two) times daily as needed (skin irritation/rash.).     metFORMIN  (GLUCOPHAGE ) 500 MG tablet Take 500 mg by mouth 2 (two) times daily with a meal.     montelukast  (SINGULAIR ) 10 MG tablet Take 10 mg by mouth at bedtime.     omeprazole (PRILOSEC) 40 MG capsule Take 40 mg by mouth daily before breakfast.      Semaglutide, 1 MG/DOSE, 4 MG/3ML SOPN Inject 1 mg into the skin once a week.     sucralfate  (CARAFATE ) 1 g tablet Take 1 g by mouth 4 (four) times daily as needed (acid reflux).     zolpidem  (AMBIEN ) 10 MG tablet Take 10 mg by mouth at bedtime as needed for sleep.     Glycerin (OASIS TEARS PF OP) Place 1 drop into both eyes 2 (two) times daily as needed (dry/irritated eyes). (Patient not taking: Reported on 06/11/2024)     lisinopril -hydrochlorothiazide  (ZESTORETIC ) 20-25 MG tablet Take 1 tablet by mouth daily. (Patient not taking: Reported on 06/11/2024)     oxyCODONE  10 MG TABS Take 1 tablet (10 mg total) by mouth every 4 (four) hours as needed for severe pain ((score 7 to 10)). (Patient not taking: Reported on 06/11/2024) 30 tablet 0   TRELEGY ELLIPTA 100-62.5-25 MCG/INH AEPB Inhale 1 puff into the lungs daily. (Patient not taking: Reported on 06/11/2024)     No current facility-administered medications on file prior to visit.    There are no Patient Instructions on file for this visit. No follow-ups on file.   Gwendlyn JONELLE Shank, NP

## 2024-07-22 NOTE — H&P (Signed)
 TOTAL KNEE ADMISSION H&P  Patient is being admitted for right total knee arthroplasty.  Subjective:  Chief Complaint: Right knee pain.  HPI: Raymond [name], 68 y.o. male has a history of pain and functional disability in the right knee due to arthritis and has failed non-surgical conservative treatments for greater than 12 weeks to include corticosteriod injections and activity modification. Onset of symptoms was gradual, starting >10 years ago with gradually worsening course since that time. The patient noted no past surgery on the right knee.  Patient currently rates pain in the right knee at 8 out of 10 with activity. Patient has night pain, worsening of pain with activity and weight bearing, pain with passive range of motion, and crepitus. 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 There is no active infection.  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    Past Medical History:  Diagnosis Date   Arthritis    Asbestos exposure    Asthma    COPD (chronic obstructive pulmonary disease) (HCC)    Diabetes mellitus without complication (HCC)    type 2    GERD (gastroesophageal reflux disease)    Hypertension    Muscle spasms of neck    Seasonal allergies    Sleep apnea    cpap- does not know settings     Past Surgical History:  Procedure Laterality Date   ANTERIOR CERVICAL DECOMP/DISCECTOMY FUSION N/A 01/20/2021   Procedure: Cervical three-four Anterior cervical decompression/discectomy/fusion/interbody prosthesis/plate/screws;  Surgeon: Mavis Purchase, MD;  Location: [facility] OR;  Service: Neurosurgery;  Laterality: N/A;   CARPAL TUNNEL RELEASE Bilateral    CERVICAL FUSION     HAND SURGERY Right    trigger finger  release   KNEE ARTHROSCOPY Left 08/21/2013   Procedure: LEFT ARTHROSCOPY KNEE WITH DEBRIDEMENT AND CHONDROPLASTY;  Surgeon: Dempsey LULLA Moan, MD;  Location: [facility] ORS;  Service: Orthopedics;  Laterality: Left;   NASAL SINUS SURGERY     TOTAL KNEE ARTHROPLASTY Left 06/11/2018   Procedure: LEFT TOTAL KNEE ARTHROPLASTY;  Surgeon: Moan Dempsey, MD;  Location: [facility] ORS;  Service: Orthopedics;  Laterality: Left;   TOTAL KNEE REVISION Left 06/17/2020   Procedure: Left total knee arthroplasty poly revision;  Surgeon: Moan Dempsey, MD;  Location: [facility] ORS;  Service: Orthopedics;  Laterality: Left;     Prior to Admission medications   Medication Sig Start Date End Date Taking? Authorizing Provider  albuterol  (PROVENTIL  HFA;VENTOLIN  HFA) 108 (90 Base) MCG/ACT inhaler Inhale 2 puffs into the lungs every 4 (four) hours as needed for wheezing or shortness of breath.    [provider]  amLODipine  (NORVASC ) 10 MG tablet Take 10 mg by mouth daily.    [provider]  aspirin  EC 81 MG tablet Take 81 mg by mouth daily. 02/22/23   [provider]  atorvastatin  (LIPITOR) 10 MG tablet Take 10 mg by mouth at bedtime.    [provider]  cyclobenzaprine  (FLEXERIL ) 10 MG tablet Take 1 tablet (10 mg total) by mouth 3 (three) times daily as needed for muscle spasms. 01/21/21   Bergman, Meghan D, NP  docusate sodium  (COLACE) 100 MG capsule Take 1 capsule (100 mg total) by mouth 2 (two) times daily. 01/21/21   Bergman, Meghan D, NP  fluticasone  (FLONASE )  50 MCG/ACT nasal spray Place 1 spray into both nostrils daily as needed for allergies or rhinitis.    [provider]  Glycerin (OASIS TEARS PF OP) Place 1 drop into both eyes 2 (two) times daily as needed (dry/irritated eyes). Patient not taking: Reported on 06/11/2024    [provider]  HYDROcodone -acetaminophen  (NORCO) 10-325 MG tablet Take 1 tablet by mouth every 4 (four) hours as needed.    [provider]   hydrocortisone valerate cream (WESTCORT) 0.2 % Apply 1 application topically 2 (two) times daily as needed (skin irritation/rash.).    [provider]  lisinopril -hydrochlorothiazide  (ZESTORETIC ) 20-25 MG tablet Take 1 tablet by mouth daily. Patient not taking: Reported on 06/11/2024    [provider]  metFORMIN  (GLUCOPHAGE ) 500 MG tablet Take 500 mg by mouth 2 (two) times daily with a meal. 05/20/20   [provider]  montelukast  (SINGULAIR ) 10 MG tablet Take 10 mg by mouth at bedtime.    [provider]  omeprazole (PRILOSEC) 40 MG capsule Take 40 mg by mouth daily before breakfast.     [provider]  oxyCODONE  10 MG TABS Take 1 tablet (10 mg total) by mouth every 4 (four) hours as needed for severe pain ((score 7 to 10)). Patient not taking: Reported on 06/11/2024 01/21/21   Bergman, Meghan D, NP  Semaglutide, 1 MG/DOSE, 4 MG/3ML SOPN Inject 1 mg into the skin once a week. 02/21/24   [provider]  sucralfate  (CARAFATE ) 1 g tablet Take 1 g by mouth 4 (four) times daily as needed (acid reflux). 12/21/20   [provider]  TRELEGY ELLIPTA 100-62.5-25 MCG/INH AEPB Inhale 1 puff into the lungs daily. Patient not taking: Reported on 06/11/2024 05/18/20   [provider]  zolpidem  (AMBIEN ) 10 MG tablet Take 10 mg by mouth at bedtime as needed for sleep.    [provider]    Allergies  Allergen Reactions   Irbesartan Other (See Comments)    Sun sensitivity/skin burning    Social History   Socioeconomic History   Marital status: Married    Spouse name: Not on file   Number of children: Not on file   Years of education: 14   Highest education level: Not on file  Occupational History   Not on file  Tobacco Use   Smoking status: Former    Current packs/day: 0.00    Average packs/day: 2.0 packs/day for 15.0 years (30.0 ttl pk-yrs)    Types: Cigarettes    Start date: 08/19/1974    Quit date: 08/19/1989     Years since quitting: 34.9   Smokeless tobacco: Never  Vaping Use   Vaping status: Never Used  Substance and Sexual Activity   Alcohol  use: Not Currently   Drug use: No   Sexual activity: Not on file  Other Topics Concern   Not on file  Social History Narrative   Not on file   Social Drivers of Health   Financial Resource Strain: Low Risk  (07/09/2024)   Received from [facility]   Overall Financial Resource Strain (CARDIA)    Difficulty of Paying Living Expenses: Not hard at all  Food Insecurity: No Food Insecurity (07/09/2024)   Received from [facility]   Hunger Vital Sign    Within the past 12 months, you worried that your food would run out before you got the money to buy more.: Never true    Within the past 12 months,  the food you bought just didn't last and you didn't have money to get more.: Never true  Transportation Needs: No Transportation Needs (07/09/2024)   Received from [facility] - Transportation    In the past 12 months, has lack of transportation kept you from medical appointments or from getting medications?: No    Lack of Transportation (Non-Medical): No  Physical Activity: Not on file  Stress: Not on file  Social Connections: Not on file  Intimate Partner Violence: Not on file    Tobacco Use: Medium Risk (06/11/2024)   Patient History    Smoking Tobacco Use: Former    Smokeless Tobacco Use: Never    Passive Exposure: Not on file   Social History   Substance and Sexual Activity  Alcohol  Use Not Currently    No family history on file.  Review of Systems  Constitutional:  Negative for chills and fever.  HENT:  Negative for congestion, sore throat and tinnitus.   Eyes:  Negative for double vision, photophobia and pain.  Respiratory:  Negative for cough, shortness of breath and wheezing.   Cardiovascular:  Negative for chest pain, palpitations and orthopnea.  Gastrointestinal:  Negative  for heartburn, nausea and vomiting.  Genitourinary:  Negative for dysuria, frequency and urgency.  Musculoskeletal:  Positive for joint pain.  Neurological:  Negative for dizziness, weakness and headaches.    Objective:  Physical Exam: Well nourished and well developed.  General: Alert and oriented x3, cooperative and pleasant, no acute distress.  Head: normocephalic, atraumatic, neck supple.  Eyes: EOMI.  Musculoskeletal:   Right knee demonstrates a large effusion. - Range of motion: 0-125 degrees. - Mild crepitus with range of motion. - Very tender medially with no lateral tenderness or instability.  Calves soft and nontender. Motor function intact in LE. Strength 5/5 LE bilaterally. Neuro: Distal pulses 2+. Sensation to light touch intact in LE.  Imaging Review Plain radiographs demonstrate severe degenerative joint disease of the right knee. The overall alignment is neutral. The bone quality appears to be adequate for age and reported activity level.  Assessment/Plan:  End stage arthritis, right knee   The patient history, physical examination, clinical judgment of the provider and imaging studies are consistent with end stage degenerative joint disease of the right knee and total knee arthroplasty is deemed medically necessary. The treatment options including medical management, injection therapy arthroscopy and arthroplasty were discussed at length. The risks and benefits of total knee arthroplasty were presented and reviewed. The risks due to aseptic loosening, infection, stiffness, patella tracking problems, thromboembolic complications and other imponderables were discussed. The patient acknowledged the explanation, agreed to proceed with the plan and consent was signed. Patient is being admitted for inpatient treatment for surgery, pain control, PT, OT, prophylactic antibiotics, VTE prophylaxis, progressive ambulation and ADLs and discharge planning. The patient is planning to  be discharged home.   Patient's anticipated LOS is less than 2 midnights, meeting these requirements: - Lives within 1 hour of care - Has a competent adult at home to recover with post-op recover - NO history of  - Coronary Artery Disease  - Heart failure  - Heart attack  - Stroke  - DVT/VTE  - Cardiac arrhythmia  - Respiratory Failure/COPD  - Renal failure  - Anemia  - Advanced Liver disease  Therapy Plans: Outpatient therapy at [facility] Disposition: Home with wife Planned DVT Prophylaxis: Aspirin  81 mg BID DME Needed: None PCP: Alm Rower, MD (LOV 07/09/2024)  TXA: IV Allergies: Irbesartan Metal Allergy: None Anesthesia Concerns: None BMI: 33.5 Last HgbA1c: 6.8% (06/2024) Pain Regimen: Oxycodone , methocarbamol  Pharmacy: [facility]  Other: - Norco 10-325 QID. Has done well with switching to oxy with previous TKAs.  - Patient was instructed on what medications to stop prior to surgery. - Follow-up visit in 2 weeks with Dr. Melodi - Begin physical therapy following surgery - Pre-operative lab work as pre-surgical testing - Prescriptions will be provided in hospital at time of discharge  Roxie Mess, PA-C Orthopedic Surgery [facility] [place]

## 2024-08-05 NOTE — Patient Instructions (Signed)
 SURGICAL WAITING ROOM VISITATION  Patients having surgery or a procedure may have no more than 2 support people in the waiting area - these visitors may rotate.    Children under the age of 33 must have an adult with them who is not the patient.  Visitors with respiratory illnesses are discouraged from visiting and should remain at home.  If the patient needs to stay at the hospital during part of their recovery, the visitor guidelines for inpatient rooms apply. Pre-op nurse will coordinate an appropriate time for 1 support person to accompany patient in pre-op.  This support person may not rotate.    Please refer to the [facility] website for the visitor guidelines for Inpatients (after your surgery is over and you are in a regular room).    Your procedure is scheduled on: 08/19/24   Report to [facility] Main Entrance    Report to admitting at 9:05 AM   Call this number if you have problems the morning of surgery [phone]   Do not eat food :After Midnight.   After Midnight you may have the following liquids until 8:35 AM DAY OF SURGERY  Water  Non-Citrus Juices (without pulp, NO RED-Apple, White grape, White cranberry) Black Coffee (NO MILK/CREAM OR CREAMERS, sugar ok)  Clear Tea (NO MILK/CREAM OR CREAMERS, sugar ok) regular and decaf                             Plain Jell-O (NO RED)                                           Fruit ices (not with fruit pulp, NO RED)                                     Popsicles (NO RED)                                                               Sports drinks like Gatorade (NO RED)     The day of surgery:  Drink ONE (1) Pre-Surgery G2 at 8:35 AM the morning of surgery. Drink in one sitting. Do not sip.  This drink was given to you during your hospital  pre-op appointment visit. Nothing else to drink after completing the  Pre-Surgery G2.          If you have questions, please contact your surgeon's office.   FOLLOW BOWEL  PREP AND ANY ADDITIONAL PRE OP INSTRUCTIONS YOU RECEIVED FROM YOUR SURGEON'S OFFICE!!!     Oral Hygiene is also important to reduce your risk of infection.                                    Remember - BRUSH YOUR TEETH THE MORNING OF SURGERY WITH YOUR REGULAR TOOTHPASTE  DENTURES WILL BE REMOVED PRIOR TO SURGERY PLEASE DO NOT APPLY Poly grip OR ADHESIVES!!!   Stop all vitamins and herbal supplements 7 days before surgery.  Take these medicines the morning of surgery with A SIP OF WATER : Inhaler, Amlodipine , Atorvastatin , Hydrocodone , Omeprazole   DO NOT TAKE ANY ORAL DIABETIC MEDICATIONS DAY OF YOUR SURGERY  How to Manage Your Diabetes Before and After Surgery  Why is it important to control my blood sugar before and after surgery? Improving blood sugar levels before and after surgery helps healing and can limit problems. A way of improving blood sugar control is eating a healthy diet by:  Eating less sugar and carbohydrates  Increasing activity/exercise  Talking with your doctor about reaching your blood sugar goals High blood sugars (greater than 180 mg/dL) can raise your risk of infections and slow your recovery, so you will need to focus on controlling your diabetes during the weeks before surgery. Make sure that the doctor who takes care of your diabetes knows about your planned surgery including the date and location.  How do I manage my blood sugar before surgery? Check your blood sugar at least 4 times a day, starting 2 days before surgery, to make sure that the level is not too high or low. Check your blood sugar the morning of your surgery when you wake up and every 2 hours until you get to the Short Stay unit. If your blood sugar is less than 70 mg/dL, you will need to treat for low blood sugar: Do not take insulin . Treat a low blood sugar (less than 70 mg/dL) with  cup of clear juice (cranberry or apple), 4 glucose tablets, OR glucose gel. Recheck blood sugar in 15  minutes after treatment (to make sure it is greater than 70 mg/dL). If your blood sugar is not greater than 70 mg/dL on recheck, call [phone] for further instructions. Report your blood sugar to the short stay nurse when you get to Short Stay.  If you are admitted to the hospital after surgery: Your blood sugar will be checked by the staff and you will probably be given insulin  after surgery (instead of oral diabetes medicines) to make sure you have good blood sugar levels. The goal for blood sugar control after surgery is 80-180 mg/dL.   WHAT DO I DO ABOUT MY DIABETES MEDICATION?  Do not take oral diabetes medicines (pills) the morning of surgery.  Do not take Semaglutide after 08/11/24.  DO NOT TAKE THE FOLLOWING 7 DAYS PRIOR TO SURGERY: Ozempic, Wegovy, Rybelsus (Semaglutide), Byetta (exenatide), Bydureon (exenatide ER), Victoza, Saxenda (liraglutide), or Trulicity (dulaglutide) Mounjaro (Tirzepatide) Adlyxin (Lixisenatide), Polyethylene Glycol Loxenatide.  Reviewed and Endorsed by [facility] Patient Education Committee, August 2015             You may not have any metal on your body including jewelry, and body piercing             Do not wear lotions, powders, cologne, or deodorant              Men may shave face and neck.   Do not bring valuables to the hospital. [facility] IS NOT             RESPONSIBLE   FOR VALUABLES.   Contacts, glasses, dentures or bridgework may not be worn into surgery.   Bring small overnight bag day of surgery.   DO NOT BRING YOUR HOME MEDICATIONS TO THE HOSPITAL. PHARMACY WILL DISPENSE MEDICATIONS LISTED ON YOUR MEDICATION LIST TO YOU DURING YOUR ADMISSION IN THE HOSPITAL!              Please read over  the following fact sheets you were given: IF YOU HAVE QUESTIONS ABOUT YOUR PRE-OP INSTRUCTIONS PLEASE CALL [phone] [place].   If you received a COVID test during your pre-op visit  it is requested that you wear a mask when out in public,  stay away from anyone that may not be feeling well and notify your surgeon if you develop symptoms. If you test positive for Covid or have been in contact with anyone that has tested positive in the last 10 days please notify you surgeon.      Pre-operative 4 CHG Bath Instructions  DYNA-Hex 4 Chlorhexidine  Gluconate 4% Solution Antiseptic 4 fl. oz   You can play a key role in reducing the risk of infection after surgery. Your skin needs to be as free of germs as possible. You can reduce the number of germs on your skin by washing with CHG (chlorhexidine  gluconate) soap before surgery. CHG is an antiseptic soap that kills germs and continues to kill germs even after washing.   DO NOT use if you have an allergy to chlorhexidine /CHG or antibacterial soaps. If your skin becomes reddened or irritated, stop using the CHG and notify one of our RNs at   Please shower with the CHG soap starting 4 days before surgery using the following schedule:     Please keep in mind the following:  DO NOT shave, including legs and underarms, starting the day of your first shower.   You may shave your face at any point before/day of surgery.  Place clean sheets on your bed the day you start using CHG soap. Use a clean washcloth (not used since being washed) for each shower. DO NOT sleep with pets once you start using the CHG.  CHG Shower Instructions:  If you choose to wash your hair and private area, wash first with your normal shampoo/soap.  After you use shampoo/soap, rinse your hair and body thoroughly to remove shampoo/soap residue.  Turn the water  OFF and apply about 3 tablespoons (45 ml) of CHG soap to a CLEAN washcloth.  Apply CHG soap ONLY FROM YOUR NECK DOWN TO YOUR TOES (washing for 3-5 minutes)  DO NOT use CHG soap on face, private areas, open wounds, or sores.  Pay special attention to the area where your surgery is being performed.  If you are having back surgery, having someone wash your back for  you may be helpful. Wait 2 minutes after CHG soap is applied, then you may rinse off the CHG soap.  Pat dry with a clean towel  Put on clean clothes/pajamas   If you choose to wear lotion, please use ONLY the CHG-compatible lotions on the back of this paper.     Additional instructions for the day of surgery: DO NOT APPLY any lotions, deodorants, cologne, or perfumes.   Put on clean/comfortable clothes.  Brush your teeth.  Ask your nurse before applying any prescription medications to the skin.   CHG Compatible Lotions   Aveeno Moisturizing lotion  Cetaphil Moisturizing Cream  Cetaphil Moisturizing Lotion  Clairol Herbal Essence Moisturizing Lotion, Dry Skin  Clairol Herbal Essence Moisturizing Lotion, Extra Dry Skin  Clairol Herbal Essence Moisturizing Lotion, Normal Skin  Curel Age Defying Therapeutic Moisturizing Lotion with Alpha Hydroxy  Curel Extreme Care Body Lotion  Curel Soothing Hands Moisturizing Hand Lotion  Curel Therapeutic Moisturizing Cream, Fragrance-Free  Curel Therapeutic Moisturizing Lotion, Fragrance-Free  Curel Therapeutic Moisturizing Lotion, Original Formula  Eucerin Daily Replenishing Lotion  Eucerin Dry Skin Therapy Plus Alpha  Hydroxy Crme  Eucerin Dry Skin Therapy Plus Alpha Hydroxy Lotion  Eucerin Original Crme  Eucerin Original Lotion  Eucerin Plus Crme Eucerin Plus Lotion  Eucerin TriLipid Replenishing Lotion  Keri Anti-Bacterial Hand Lotion  Keri Deep Conditioning Original Lotion Dry Skin Formula Softly Scented  Keri Deep Conditioning Original Lotion, Fragrance Free Sensitive Skin Formula  Keri Lotion Fast Absorbing Fragrance Free Sensitive Skin Formula  Keri Lotion Fast Absorbing Softly Scented Dry Skin Formula  Keri Original Lotion  Keri Skin Renewal Lotion Keri Silky Smooth Lotion  Keri Silky Smooth Sensitive Skin Lotion  Nivea Body Creamy Conditioning Oil  Nivea Body Extra Enriched Lotion  Nivea Body Original Lotion  Nivea Body Sheer  Moisturizing Lotion Nivea Crme  Nivea Skin Firming Lotion  NutraDerm 30 Skin Lotion  NutraDerm Skin Lotion  NutraDerm Therapeutic Skin Cream  NutraDerm Therapeutic Skin Lotion  ProShield Protective Hand Cream  Provon moisturizing lotionCone Health - Preparing for Surgery Before surgery, you can play an important role.  Because skin is not sterile, your skin needs to be as free of germs as possible.  You can reduce the number of germs on your skin by washing with CHG (chlorahexidine gluconate) soap before surgery.  CHG is an antiseptic cleaner which kills germs and bonds with the skin to continue killing germs even after washing. Please DO NOT use if you have an allergy to CHG or antibacterial soaps.  If your skin becomes reddened/irritated stop using the CHG and inform your nurse when you arrive at Short Stay. Do not shave (including legs and underarms) for at least 48 hours prior to the first CHG shower.  You may shave your face/neck.  Please follow these instructions carefully:  1.  Shower with CHG Soap the night before surgery ONLY (DO NOT USE THE SOAP THE MORNING OF SURGERY).  2.  If you choose to wash your hair, wash your hair first as usual with your normal  shampoo.  3.  After you shampoo, rinse your hair and body thoroughly to remove the shampoo.                             4.  Use CHG as you would any other liquid soap.  You can apply chg directly to the skin and wash.  Gently with a scrungie or clean washcloth.  5.  Apply the CHG Soap to your body ONLY FROM THE NECK DOWN.   Do   not use on face/ open                           Wound or open sores. Avoid contact with eyes, ears mouth and   genitals (private parts).                       Wash face,  Genitals (private parts) with your normal soap.             6.  Wash thoroughly, paying special attention to the area where your    surgery  will be performed.  7.  Thoroughly rinse your body with warm water  from the neck down.  8.  DO NOT  shower/wash with your normal soap after using and rinsing off the CHG Soap.                9.  Pat yourself dry with a clean towel.  10.  Wear clean pajamas.            11.  Place clean sheets on your bed the night of your first shower and do not  sleep with pets. Day of Surgery : Do not apply any CHG, lotions/deodorants the morning of surgery.  Please wear clean clothes to the hospital/surgery center.  FAILURE TO FOLLOW THESE INSTRUCTIONS MAY RESULT IN THE CANCELLATION OF YOUR SURGERY  PATIENT SIGNATURE_________________________________  NURSE SIGNATURE__________________________________  ________________________________________________________________________

## 2024-08-05 NOTE — Progress Notes (Signed)
 Date of COVID positive in last 90 days:  PCP - Norleen Rower, MD Cardiologist -  Nephrology- Pinkey Edman, MD Pulmonary- Halina Picking, MD  Chest x-ray - N/A EKG - N/A Stress Test - N/A ECHO - N/A Cardiac Cath - N/A Pacemaker/ICD device last checked:N/A Spinal Cord Stimulator:N/A  Bowel Prep - N/A  Sleep Study - yes CPAP - no  Fasting Blood Sugar -  Checks Blood Sugar _____ times a day  Last dose of GLP1 agonist-  Semaglutide  GLP1 instructions:  Do not take after     Last dose of SGLT-2 inhibitors-  N/A SGLT-2 instructions:  Do not take after     Blood Thinner Instructions: N/A Last dose:   Time: Aspirin  Instructions: ASA 81 Last Dose:  Activity level:  Can go up a flight of stairs and perform activities of daily living without stopping and without symptoms of chest pain or shortness of breath.  Able to exercise without symptoms  Unable to go up a flight of stairs without symptoms of     Anesthesia review: anemia, DM2, HTN, COPD, OSA, aortic atherosclerosis  Patient denies shortness of breath, fever, cough and chest pain at PAT appointment  Patient verbalized understanding of instructions that were given to them at the PAT appointment. Patient was also instructed that they will need to review over the PAT instructions again at home before surgery.

## 2024-08-06 ENCOUNTER — Encounter (HOSPITAL_COMMUNITY): Payer: Self-pay

## 2024-08-06 ENCOUNTER — Encounter (HOSPITAL_COMMUNITY)
Admission: RE | Admit: 2024-08-06 | Discharge: 2024-08-06 | Disposition: A | Source: Ambulatory Visit | Attending: Orthopedic Surgery

## 2024-08-06 ENCOUNTER — Other Ambulatory Visit: Payer: Self-pay

## 2024-08-06 VITALS — BP 157/91 | HR 105 | Temp 98.5°F | Resp 16 | Ht 74.0 in | Wt 262.0 lb

## 2024-08-06 DIAGNOSIS — M1711 Unilateral primary osteoarthritis, right knee: Secondary | ICD-10-CM | POA: Insufficient documentation

## 2024-08-06 DIAGNOSIS — Z87891 Personal history of nicotine dependence: Secondary | ICD-10-CM | POA: Insufficient documentation

## 2024-08-06 DIAGNOSIS — K2270 Barrett's esophagus without dysplasia: Secondary | ICD-10-CM | POA: Insufficient documentation

## 2024-08-06 DIAGNOSIS — J4489 Other specified chronic obstructive pulmonary disease: Secondary | ICD-10-CM | POA: Insufficient documentation

## 2024-08-06 DIAGNOSIS — Z01818 Encounter for other preprocedural examination: Secondary | ICD-10-CM | POA: Insufficient documentation

## 2024-08-06 DIAGNOSIS — K219 Gastro-esophageal reflux disease without esophagitis: Secondary | ICD-10-CM | POA: Insufficient documentation

## 2024-08-06 DIAGNOSIS — Z7984 Long term (current) use of oral hypoglycemic drugs: Secondary | ICD-10-CM | POA: Insufficient documentation

## 2024-08-06 DIAGNOSIS — R000 Tachycardia, unspecified: Secondary | ICD-10-CM | POA: Insufficient documentation

## 2024-08-06 DIAGNOSIS — G4733 Obstructive sleep apnea (adult) (pediatric): Secondary | ICD-10-CM | POA: Insufficient documentation

## 2024-08-06 DIAGNOSIS — E119 Type 2 diabetes mellitus without complications: Secondary | ICD-10-CM | POA: Insufficient documentation

## 2024-08-06 DIAGNOSIS — I4510 Unspecified right bundle-branch block: Secondary | ICD-10-CM | POA: Insufficient documentation

## 2024-08-06 DIAGNOSIS — Z981 Arthrodesis status: Secondary | ICD-10-CM | POA: Insufficient documentation

## 2024-08-06 DIAGNOSIS — I10 Essential (primary) hypertension: Secondary | ICD-10-CM | POA: Insufficient documentation

## 2024-08-06 LAB — CBC
HCT: 47.7 % (ref 39.0–52.0)
Hemoglobin: 15.5 g/dL (ref 13.0–17.0)
MCH: 30.9 pg (ref 26.0–34.0)
MCHC: 32.5 g/dL (ref 30.0–36.0)
MCV: 95.2 fL (ref 80.0–100.0)
Platelets: 368 K/uL (ref 150–400)
RBC: 5.01 MIL/uL (ref 4.22–5.81)
RDW: 13.5 % (ref 11.5–15.5)
WBC: 9.3 K/uL (ref 4.0–10.5)
nRBC: 0 % (ref 0.0–0.2)

## 2024-08-06 LAB — BASIC METABOLIC PANEL WITH GFR
Anion gap: 16 — ABNORMAL HIGH (ref 5–15)
BUN: 13 mg/dL (ref 8–23)
CO2: 24 mmol/L (ref 22–32)
Calcium: 9.9 mg/dL (ref 8.9–10.3)
Chloride: 100 mmol/L (ref 98–111)
Creatinine, Ser: 0.87 mg/dL (ref 0.61–1.24)
GFR, Estimated: >60 mL/min (ref >60)
Glucose, Bld: 138 mg/dL — ABNORMAL HIGH (ref 70–99)
Potassium: 3.9 mmol/L (ref 3.5–5.1)
Sodium: 139 mmol/L (ref 135–145)

## 2024-08-06 LAB — SURGICAL PCR SCREEN
MRSA, PCR: NEGATIVE
Staphylococcus aureus: POSITIVE — AB

## 2024-08-06 LAB — GLUCOSE, CAPILLARY: Glucose-Capillary: 148 mg/dL — ABNORMAL HIGH (ref 70–99)

## 2024-08-07 ENCOUNTER — Encounter (HOSPITAL_COMMUNITY): Payer: Self-pay

## 2024-08-07 NOTE — Progress Notes (Signed)
 Case: [id number] Date/Time: 08/19/24 1120   Procedure: ARTHROPLASTY, KNEE, TOTAL (Right: Knee)   Anesthesia type: Choice   Pre-op diagnosis: Right knee osteoarthritis   [facility] 09 [facility]   Surgeons: Melodi Lerner, MD       DISCUSSION: Raymond [name] is a 68 yo male with PMH of former smoking, HTN, asthma, OSA (CPAP intolerance), GERD, Barretts esophagus, DM (A1c 6.8), arthritis, hx of ACDF C3-4 (2022).  Patient follows with Pulmonology at [facility] for asthma and OSA. He uses inhalers.He has a CPAP but has intolerance. Stable at last visit  Seen by PCP on 07/09/24. All issues controlled.  Patient with mild tachycardia at PAT visit. On chart review it appears he has chronic tachycardia. He was seen by Cardiology at [facility] in 02/2023 for CV risk assessment. Medical therapy recommended and advised f/u as needed.   At PAT visit pt reports he can do stairs without CP/SOB   VS: BP (!) 157/91   Pulse (!) 105   Temp 36.9 C (Oral)   Resp 16   Ht 6' 2 (1.88 m)   Wt 118.8 kg   SpO2 96%   BMI 33.64 kg/m   PROVIDERS: Rudolpho Norleen BIRCH, MD   LABS: Labs reviewed: Acceptable for surgery. (all labs ordered are listed, but only abnormal results are displayed)  Labs Reviewed  SURGICAL PCR SCREEN - Abnormal; Notable for the following components:      Result Value   Staphylococcus aureus POSITIVE (*)    All other components within normal limits  BASIC METABOLIC PANEL WITH GFR - Abnormal; Notable for the following components:   Glucose, Bld 138 (*)    Anion gap 16 (*)    All other components within normal limits  GLUCOSE, CAPILLARY - Abnormal; Notable for the following components:   Glucose-Capillary 148 (*)    All other components within normal limits  CBC    EKG 08/06/24:  Sinus tachycardia, 107 LAD RBBB Inferior infarct, age undetermined  CT Calcium  score 02/16/24: IMPRESSION AND RECOMMENDATION: 1. Coronary calcium  score of 92.9. This was 56th percentile for age and sex  matched control.   2. CAC 1-99 in LM, LAD, RCA. CAC-DRS A1/N3.   3. Continue heart healthy lifestyle and risk factor modification.  Past Medical History:  Diagnosis Date   Arthritis    Asbestos exposure    Asthma    COPD (chronic obstructive pulmonary disease) (HCC)    Diabetes mellitus without complication (HCC)    type 2    GERD (gastroesophageal reflux disease)    Hypertension    Muscle spasms of neck    Seasonal allergies    Sleep apnea    cpap- does not know settings     Past Surgical History:  Procedure Laterality Date   ANTERIOR CERVICAL DECOMP/DISCECTOMY FUSION N/A 01/20/2021   Procedure: Cervical three-four Anterior cervical decompression/discectomy/fusion/interbody prosthesis/plate/screws;  Surgeon: Mavis Purchase, MD;  Location: [facility];  Service: Neurosurgery;  Laterality: N/A;   CARPAL TUNNEL RELEASE Bilateral    CERVICAL FUSION     COLONOSCOPY     multiple   HAND SURGERY Right    trigger finger release   KNEE ARTHROSCOPY Left 08/21/2013   Procedure: LEFT ARTHROSCOPY KNEE WITH DEBRIDEMENT AND CHONDROPLASTY;  Surgeon: Lerner LULLA Melodi, MD;  [facility];  Service: Orthopedics;  Laterality: Left;   NASAL SINUS SURGERY     TOTAL KNEE ARTHROPLASTY Left 06/11/2018   Procedure: LEFT TOTAL KNEE ARTHROPLASTY;  Surgeon: Melodi Lerner, MD;  Location: [facility];  Service: Orthopedics;  Laterality: Left;   TOTAL KNEE REVISION Left 06/17/2020   Procedure: Left total knee arthroplasty poly revision;  Surgeon: Melodi Lerner, MD;  [facility];  Service: Orthopedics;  Laterality: Left;     MEDICATIONS:  albuterol  (PROVENTIL  HFA;VENTOLIN  HFA) 108 (90 Base) MCG/ACT inhaler   amLODipine  (NORVASC ) 10 MG tablet   aspirin  EC 81 MG tablet   atorvastatin  (LIPITOR) 40 MG tablet   caffeine 200 MG TABS tablet   cyclobenzaprine  (FLEXERIL ) 10 MG tablet   docusate sodium  (COLACE) 100 MG capsule   EPINEPHrine  0.3 mg/0.3 mL IJ SOAJ injection   fluticasone  (FLONASE ) 50  MCG/ACT nasal spray   Glycerin (OASIS TEARS PF OP)   HYDROcodone -acetaminophen  (NORCO) 10-325 MG tablet   hydrocortisone valerate cream (WESTCORT) 0.2 %   lisinopril -hydrochlorothiazide  (ZESTORETIC ) 20-25 MG tablet   metFORMIN  (GLUCOPHAGE ) 500 MG tablet   montelukast  (SINGULAIR ) 10 MG tablet   omeprazole (PRILOSEC) 40 MG capsule   Semaglutide, 2 MG/DOSE, 8 MG/3ML SOPN   sucralfate  (CARAFATE ) 1 g tablet   TRELEGY ELLIPTA 100-62.5-25 MCG/INH AEPB   No current facility-administered medications for this encounter.   Raymond [name] Raymond [name] Surgical Short Stay/Anesthesiology [facility] Phone [phone] 08/07/2024 11:16 AM

## 2024-08-07 NOTE — Anesthesia Preprocedure Evaluation (Addendum)
 Anesthesia Evaluation  Patient identified by MRN, date of birth, ID band Patient awake    Reviewed: Allergy & Precautions, NPO status , Patient's Chart, lab work & pertinent test results  Airway Mallampati: III  TM Distance: >3 FB Neck ROM: Full    Dental no notable dental hx.    Pulmonary asthma , sleep apnea , COPD,  COPD inhaler, former smoker   Pulmonary exam normal        Cardiovascular hypertension, Pt. on medications Normal cardiovascular exam     Neuro/Psych  Neuromuscular disease    GI/Hepatic ,GERD  Medicated and Controlled,,(+)     substance abuse    Endo/Other  diabetes, Oral Hypoglycemic Agents  Patient on GLP-1 Agonist. Last dose 12/06  Renal/GU negative Renal ROS     Musculoskeletal  (+) Arthritis ,  narcotic dependentChronic pain   Abdominal   Peds  Hematology negative hematology ROS (+)   Anesthesia Other Findings Right knee osteoarthritis  Reproductive/Obstetrics                              Anesthesia Physical Anesthesia Plan  ASA: 3  Anesthesia Plan: Regional and Spinal   Post-op Pain Management: Regional block*   Induction:   PONV Risk Score and Plan: 1 and Ondansetron , Dexamethasone , Propofol  infusion and Treatment may vary due to age or medical condition  Airway Management Planned: Simple Face Mask  Additional Equipment:   Intra-op Plan:   Post-operative Plan:   Informed Consent: I have reviewed the patients History and Physical, chart, labs and discussed the procedure including the risks, benefits and alternatives for the proposed anesthesia with the patient or authorized representative who has indicated his/her understanding and acceptance.     Dental advisory given  Plan Discussed with: CRNA  Anesthesia Plan Comments: (PAT note from 12/2)         Anesthesia Quick Evaluation

## 2024-08-07 NOTE — Progress Notes (Signed)
 [facility] results routed to Dr. Lequita Halt

## 2024-08-19 ENCOUNTER — Other Ambulatory Visit: Payer: Self-pay

## 2024-08-19 ENCOUNTER — Ambulatory Visit (HOSPITAL_COMMUNITY): Admitting: Anesthesiology

## 2024-08-19 ENCOUNTER — Observation Stay (HOSPITAL_COMMUNITY)
Admission: RE | Admit: 2024-08-19 | Discharge: 2024-08-20 | Disposition: A | Discharge status: Home / Self Care | Source: Ambulatory Visit | Attending: Orthopedic Surgery | Admitting: Orthopedic Surgery

## 2024-08-19 ENCOUNTER — Encounter (HOSPITAL_COMMUNITY): Payer: Self-pay | Admitting: Orthopedic Surgery

## 2024-08-19 ENCOUNTER — Encounter (HOSPITAL_COMMUNITY): Admission: RE | Disposition: A | Payer: Self-pay | Source: Ambulatory Visit | Attending: Orthopedic Surgery

## 2024-08-19 ENCOUNTER — Ambulatory Visit (HOSPITAL_COMMUNITY): Payer: Self-pay | Admitting: Medical

## 2024-08-19 DIAGNOSIS — Z7984 Long term (current) use of oral hypoglycemic drugs: Secondary | ICD-10-CM | POA: Diagnosis not present

## 2024-08-19 DIAGNOSIS — E119 Type 2 diabetes mellitus without complications: Secondary | ICD-10-CM | POA: Insufficient documentation

## 2024-08-19 DIAGNOSIS — J449 Chronic obstructive pulmonary disease, unspecified: Secondary | ICD-10-CM | POA: Diagnosis not present

## 2024-08-19 DIAGNOSIS — M1711 Unilateral primary osteoarthritis, right knee: Principal | ICD-10-CM | POA: Diagnosis present

## 2024-08-19 DIAGNOSIS — Z79899 Other long term (current) drug therapy: Secondary | ICD-10-CM | POA: Insufficient documentation

## 2024-08-19 DIAGNOSIS — Z87891 Personal history of nicotine dependence: Secondary | ICD-10-CM | POA: Insufficient documentation

## 2024-08-19 DIAGNOSIS — Z96652 Presence of left artificial knee joint: Secondary | ICD-10-CM | POA: Diagnosis not present

## 2024-08-19 DIAGNOSIS — J4489 Other specified chronic obstructive pulmonary disease: Secondary | ICD-10-CM | POA: Diagnosis not present

## 2024-08-19 DIAGNOSIS — I10 Essential (primary) hypertension: Secondary | ICD-10-CM | POA: Insufficient documentation

## 2024-08-19 DIAGNOSIS — M179 Osteoarthritis of knee, unspecified: Secondary | ICD-10-CM | POA: Diagnosis present

## 2024-08-19 HISTORY — PX: TOTAL KNEE ARTHROPLASTY: SHX125

## 2024-08-19 LAB — BASIC METABOLIC PANEL WITH GFR
Anion gap: 13 (ref 5–15)
BUN: 11 mg/dL (ref 8–23)
CO2: 25 mmol/L (ref 22–32)
Calcium: 9.5 mg/dL (ref 8.9–10.3)
Chloride: 100 mmol/L (ref 98–111)
Creatinine, Ser: 0.9 mg/dL (ref 0.61–1.24)
GFR, Estimated: >60 mL/min (ref >60)
Glucose, Bld: 128 mg/dL — ABNORMAL HIGH (ref 70–99)
Potassium: 3.8 mmol/L (ref 3.5–5.1)
Sodium: 138 mmol/L (ref 135–145)

## 2024-08-19 LAB — GLUCOSE, CAPILLARY
Glucose-Capillary: 149 mg/dL — ABNORMAL HIGH (ref 70–99)
Glucose-Capillary: 133 mg/dL — ABNORMAL HIGH (ref 70–99)
Glucose-Capillary: 128 mg/dL — ABNORMAL HIGH (ref 70–99)
Glucose-Capillary: 192 mg/dL — ABNORMAL HIGH (ref 70–99)
Glucose-Capillary: 177 mg/dL — ABNORMAL HIGH (ref 70–99)

## 2024-08-19 LAB — HEMOGLOBIN A1C
Hgb A1c MFr Bld: 6.4 % — ABNORMAL HIGH (ref 4.8–5.6)
Mean Plasma Glucose: 136.98 mg/dL

## 2024-08-19 SURGERY — ARTHROPLASTY, KNEE, TOTAL
Anesthesia: Regional | Site: Knee | Laterality: Right

## 2024-08-19 MED ORDER — METHOCARBAMOL 500 MG PO TABS
500.0000 mg | ORAL_TABLET | Freq: Four times a day (QID) | ORAL | Status: DC | PRN
  Administered 2024-08-19 – 2024-08-20 (×2): 500 mg via ORAL
  Filled 2024-08-19 (×2): qty 1

## 2024-08-19 MED ORDER — ONDANSETRON HCL 4 MG/2ML IJ SOLN: 4.0000 mg | Freq: Once | INTRAMUSCULAR | Status: DC | PRN

## 2024-08-19 MED ORDER — ALBUTEROL SULFATE (2.5 MG/3ML) 0.083% IN NEBU: 3.0000 mL | INHALATION_SOLUTION | RESPIRATORY_TRACT | Status: DC | PRN

## 2024-08-19 MED ORDER — BUPIVACAINE IN DEXTROSE 0.75-8.25 % IT SOLN
INTRATHECAL | Status: DC | PRN
  Administered 2024-08-19: 12:00:00 1.6 mL via INTRATHECAL

## 2024-08-19 MED ORDER — STERILE WATER FOR IRRIGATION IR SOLN
Status: DC | PRN
  Administered 2024-08-19: 12:00:00 2000 mL

## 2024-08-19 MED ORDER — POLYETHYLENE GLYCOL 3350 17 G PO PACK: 17.0000 g | PACK | Freq: Every day | ORAL | Status: DC | PRN

## 2024-08-19 MED ORDER — ONDANSETRON HCL 4 MG/2ML IJ SOLN
INTRAMUSCULAR | Status: DC | PRN
  Administered 2024-08-19: 12:00:00 4 mg via INTRAVENOUS

## 2024-08-19 MED ORDER — MENTHOL 3 MG MT LOZG: 1.0000 | LOZENGE | OROMUCOSAL | Status: DC | PRN

## 2024-08-19 MED ORDER — SODIUM CHLORIDE (PF) 0.9 % IJ SOLN
INTRAMUSCULAR | Status: DC | PRN
  Administered 2024-08-19: 13:00:00 80 mL

## 2024-08-19 MED ORDER — ONDANSETRON HCL 4 MG PO TABS: 4.0000 mg | ORAL_TABLET | Freq: Four times a day (QID) | ORAL | Status: DC | PRN

## 2024-08-19 MED ORDER — ATORVASTATIN CALCIUM 40 MG PO TABS
40.0000 mg | ORAL_TABLET | Freq: Every day | ORAL | Status: DC
  Administered 2024-08-19: 21:00:00 40 mg via ORAL
  Filled 2024-08-19: qty 1

## 2024-08-19 MED ORDER — MIDAZOLAM HCL (PF) 2 MG/2ML IJ SOLN: 1.0000 mg | INTRAMUSCULAR | Status: DC

## 2024-08-19 MED ORDER — CHLORHEXIDINE GLUCONATE 4 % EX SOLN: 1.0000 | CUTANEOUS | 1 refills | Status: AC

## 2024-08-19 MED ORDER — BUPIVACAINE-EPINEPHRINE (PF) 0.25% -1:200000 IJ SOLN
INTRAMUSCULAR | Status: DC | PRN
  Administered 2024-08-19: 11:00:00 30 mL via PERINEURAL

## 2024-08-19 MED ORDER — PHENYLEPHRINE 80 MCG/ML (10ML) SYRINGE FOR IV PUSH (FOR BLOOD PRESSURE SUPPORT)
PREFILLED_SYRINGE | INTRAVENOUS | Status: DC | PRN
  Administered 2024-08-19: 13:00:00 160 ug via INTRAVENOUS

## 2024-08-19 MED ORDER — CHLORHEXIDINE GLUCONATE 0.12 % MT SOLN
15.0000 mL | Freq: Once | OROMUCOSAL | Status: AC
  Administered 2024-08-19: 09:00:00 15 mL via OROMUCOSAL

## 2024-08-19 MED ORDER — GABAPENTIN 300 MG PO CAPS
300.0000 mg | ORAL_CAPSULE | Freq: Three times a day (TID) | ORAL | Status: DC
  Administered 2024-08-19 – 2024-08-20 (×3): 300 mg via ORAL
  Filled 2024-08-19 (×3): qty 1

## 2024-08-19 MED ORDER — SODIUM CHLORIDE 0.9 % IV SOLN: INTRAVENOUS | Status: DC

## 2024-08-19 MED ORDER — TRAMADOL HCL 50 MG PO TABS
50.0000 mg | ORAL_TABLET | Freq: Four times a day (QID) | ORAL | Status: DC | PRN
  Administered 2024-08-19 – 2024-08-20 (×3): 100 mg via ORAL
  Filled 2024-08-19 (×3): qty 2

## 2024-08-19 MED ORDER — METHOCARBAMOL 1000 MG/10ML IJ SOLN: 500.0000 mg | Freq: Four times a day (QID) | INTRAMUSCULAR | Status: DC | PRN

## 2024-08-19 MED ORDER — MONTELUKAST SODIUM 10 MG PO TABS
10.0000 mg | ORAL_TABLET | Freq: Every day | ORAL | Status: DC
  Administered 2024-08-19: 21:00:00 10 mg via ORAL
  Filled 2024-08-19: qty 1

## 2024-08-19 MED ORDER — PROPOFOL 10 MG/ML IV BOLUS
INTRAVENOUS | Status: AC
  Filled 2024-08-19: qty 20

## 2024-08-19 MED ORDER — ONDANSETRON HCL 4 MG/2ML IJ SOLN: 4.0000 mg | Freq: Four times a day (QID) | INTRAMUSCULAR | Status: DC | PRN

## 2024-08-19 MED ORDER — METOCLOPRAMIDE HCL 5 MG/ML IJ SOLN: 5.0000 mg | Freq: Three times a day (TID) | INTRAMUSCULAR | Status: DC | PRN

## 2024-08-19 MED ORDER — OXYCODONE HCL 5 MG PO TABS
5.0000 mg | ORAL_TABLET | ORAL | Status: DC | PRN
  Administered 2024-08-20: 02:00:00 10 mg via ORAL
  Filled 2024-08-19: qty 2
  Filled 2024-08-19: qty 1

## 2024-08-19 MED ORDER — AMISULPRIDE (ANTIEMETIC) 5 MG/2ML IV SOLN: 10.0000 mg | Freq: Once | INTRAVENOUS | Status: DC | PRN

## 2024-08-19 MED ORDER — ACETAMINOPHEN 325 MG PO TABS: 325.0000 mg | ORAL_TABLET | Freq: Four times a day (QID) | ORAL | Status: DC | PRN

## 2024-08-19 MED ORDER — HYDROMORPHONE HCL 1 MG/ML IJ SOLN: 0.5000 mg | INTRAMUSCULAR | Status: DC | PRN

## 2024-08-19 MED ORDER — MUPIROCIN 2 % EX OINT: 1.0000 | TOPICAL_OINTMENT | Freq: Two times a day (BID) | CUTANEOUS | 0 refills | Status: AC

## 2024-08-19 MED ORDER — LACTATED RINGERS IV SOLN: INTRAVENOUS | Status: DC

## 2024-08-19 MED ORDER — SODIUM CHLORIDE 0.9 % IR SOLN
Status: DC | PRN
  Administered 2024-08-19: 12:00:00 1000 mL

## 2024-08-19 MED ORDER — PHENOL 1.4 % MT LIQD: 1.0000 | OROMUCOSAL | Status: DC | PRN

## 2024-08-19 MED ORDER — SODIUM CHLORIDE (PF) 0.9 % IJ SOLN
INTRAMUSCULAR | Status: AC
  Filled 2024-08-19: qty 50

## 2024-08-19 MED ORDER — FENTANYL CITRATE (PF) 50 MCG/ML IJ SOSY
50.0000 ug | PREFILLED_SYRINGE | INTRAMUSCULAR | Status: DC
  Administered 2024-08-19: 11:00:00 50 ug via INTRAVENOUS
  Filled 2024-08-19: qty 2

## 2024-08-19 MED ORDER — BUPIVACAINE LIPOSOME 1.3 % IJ SUSP: 20.0000 mL | Freq: Once | INTRAMUSCULAR | Status: DC

## 2024-08-19 MED ORDER — INSULIN ASPART 100 UNIT/ML IJ SOLN: 0.0000 [IU] | Freq: Every day | INTRAMUSCULAR | Status: DC

## 2024-08-19 MED ORDER — TRANEXAMIC ACID-NACL 1000-0.7 MG/100ML-% IV SOLN
1000.0000 mg | INTRAVENOUS | Status: AC
  Administered 2024-08-19: 12:00:00 1000 mg via INTRAVENOUS
  Filled 2024-08-19: qty 100

## 2024-08-19 MED ORDER — BUPIVACAINE LIPOSOME 1.3 % IJ SUSP
INTRAMUSCULAR | Status: AC
  Filled 2024-08-19: qty 20

## 2024-08-19 MED ORDER — ASPIRIN 81 MG PO CHEW
81.0000 mg | CHEWABLE_TABLET | Freq: Two times a day (BID) | ORAL | Status: DC
  Administered 2024-08-20: 08:00:00 81 mg via ORAL
  Filled 2024-08-19: qty 1

## 2024-08-19 MED ORDER — DOCUSATE SODIUM 100 MG PO CAPS
100.0000 mg | ORAL_CAPSULE | Freq: Two times a day (BID) | ORAL | Status: DC
  Administered 2024-08-19 – 2024-08-20 (×2): 100 mg via ORAL
  Filled 2024-08-19 (×2): qty 1

## 2024-08-19 MED ORDER — DEXAMETHASONE SOD PHOSPHATE PF 10 MG/ML IJ SOLN
8.0000 mg | Freq: Once | INTRAMUSCULAR | Status: AC
  Administered 2024-08-19: 12:00:00 8 mg via INTRAVENOUS

## 2024-08-19 MED ORDER — CEFAZOLIN SODIUM-DEXTROSE 2-4 GM/100ML-% IV SOLN
2.0000 g | INTRAVENOUS | Status: AC
  Administered 2024-08-19: 12:00:00 2 g via INTRAVENOUS
  Filled 2024-08-19: qty 100

## 2024-08-19 MED ORDER — BISACODYL 10 MG RE SUPP: 10.0000 mg | Freq: Every day | RECTAL | Status: DC | PRN

## 2024-08-19 MED ORDER — DIPHENHYDRAMINE HCL 12.5 MG/5ML PO ELIX: 12.5000 mg | ORAL_SOLUTION | ORAL | Status: DC | PRN

## 2024-08-19 MED ORDER — ONDANSETRON HCL 4 MG/2ML IJ SOLN
INTRAMUSCULAR | Status: AC
  Filled 2024-08-19: qty 2

## 2024-08-19 MED ORDER — PROPOFOL 500 MG/50ML IV EMUL
INTRAVENOUS | Status: DC | PRN
  Administered 2024-08-19: 12:00:00 50 mg via INTRAVENOUS
  Administered 2024-08-19: 12:00:00 150 ug/kg/min via INTRAVENOUS

## 2024-08-19 MED ORDER — OXYCODONE HCL 5 MG PO TABS
10.0000 mg | ORAL_TABLET | ORAL | Status: DC | PRN
  Administered 2024-08-19: 21:00:00 15 mg via ORAL
  Administered 2024-08-19: 16:00:00 10 mg via ORAL
  Administered 2024-08-20: 08:00:00 15 mg via ORAL
  Filled 2024-08-19: qty 3
  Filled 2024-08-19 (×2): qty 2

## 2024-08-19 MED ORDER — INSULIN ASPART 100 UNIT/ML IJ SOLN
0.0000 [IU] | INTRAMUSCULAR | Status: DC | PRN
  Administered 2024-08-19: 10:00:00 2 [IU] via SUBCUTANEOUS
  Filled 2024-08-19: qty 2

## 2024-08-19 MED ORDER — FLEET ENEMA RE ENEM: 1.0000 | ENEMA | Freq: Once | RECTAL | Status: DC | PRN

## 2024-08-19 MED ORDER — PROPOFOL 1000 MG/100ML IV EMUL
INTRAVENOUS | Status: AC
  Filled 2024-08-19: qty 100

## 2024-08-19 MED ORDER — INSULIN ASPART 100 UNIT/ML IJ SOLN
0.0000 [IU] | Freq: Three times a day (TID) | INTRAMUSCULAR | Status: DC
  Administered 2024-08-19 – 2024-08-20 (×2): 3 [IU] via SUBCUTANEOUS
  Filled 2024-08-19 (×2): qty 3

## 2024-08-19 MED ORDER — AMLODIPINE BESYLATE 10 MG PO TABS
10.0000 mg | ORAL_TABLET | Freq: Every day | ORAL | Status: DC
  Administered 2024-08-20: 08:00:00 10 mg via ORAL
  Filled 2024-08-19: qty 1

## 2024-08-19 MED ORDER — 0.9 % SODIUM CHLORIDE (POUR BTL) OPTIME
TOPICAL | Status: DC | PRN
  Administered 2024-08-19: 12:00:00 1000 mL

## 2024-08-19 MED ORDER — METOCLOPRAMIDE HCL 5 MG PO TABS: 5.0000 mg | ORAL_TABLET | Freq: Three times a day (TID) | ORAL | Status: DC | PRN

## 2024-08-19 MED ORDER — SODIUM CHLORIDE (PF) 0.9 % IJ SOLN
INTRAMUSCULAR | Status: AC
  Filled 2024-08-19: qty 10

## 2024-08-19 MED ORDER — ACETAMINOPHEN 10 MG/ML IV SOLN
1000.0000 mg | Freq: Four times a day (QID) | INTRAVENOUS | Status: DC
  Administered 2024-08-19: 12:00:00 1000 mg via INTRAVENOUS
  Filled 2024-08-19: qty 100

## 2024-08-19 MED ORDER — CEFAZOLIN SODIUM-DEXTROSE 2-4 GM/100ML-% IV SOLN
2.0000 g | Freq: Four times a day (QID) | INTRAVENOUS | Status: AC
  Administered 2024-08-19 – 2024-08-20 (×2): 2 g via INTRAVENOUS
  Filled 2024-08-19 (×2): qty 100

## 2024-08-19 MED ORDER — FENTANYL CITRATE (PF) 50 MCG/ML IJ SOSY: 25.0000 ug | PREFILLED_SYRINGE | INTRAMUSCULAR | Status: DC | PRN

## 2024-08-19 MED ORDER — POVIDONE-IODINE 10 % EX SWAB: 2.0000 | Freq: Once | CUTANEOUS | Status: DC

## 2024-08-19 MED ORDER — ACETAMINOPHEN 500 MG PO TABS
1000.0000 mg | ORAL_TABLET | Freq: Four times a day (QID) | ORAL | Status: DC
  Administered 2024-08-19 – 2024-08-20 (×3): 1000 mg via ORAL
  Filled 2024-08-19 (×3): qty 2

## 2024-08-19 MED ORDER — ORAL CARE MOUTH RINSE: 15.0000 mL | Freq: Once | OROMUCOSAL | Status: AC

## 2024-08-19 MED ORDER — PANTOPRAZOLE SODIUM 40 MG PO TBEC
80.0000 mg | DELAYED_RELEASE_TABLET | Freq: Every day | ORAL | Status: DC
  Administered 2024-08-19 – 2024-08-20 (×2): 80 mg via ORAL
  Filled 2024-08-19 (×2): qty 2

## 2024-08-19 SURGICAL SUPPLY — 42 items
ATTUNE MED DOME PAT 41 KNEE (Knees) IMPLANT
ATTUNE PS FEM RT SZ9 CEM KNEE (Femur) IMPLANT
ATTUNE PS RP INSR SZ9 8 KNEE (Femur) IMPLANT
BAG COUNTER SPONGE SURGICOUNT (BAG) IMPLANT
BAG ZIPLOCK 12X15 (MISCELLANEOUS) ×1 IMPLANT
BASE TIBIAL ROT PLAT SZ 8 KNEE (Knees) IMPLANT
BLADE SAG 18X100X1.27 (BLADE) ×1 IMPLANT
BLADE SAW SGTL 11.0X1.19X90.0M (BLADE) ×1 IMPLANT
BNDG ELASTIC 6INX 5YD STR LF (GAUZE/BANDAGES/DRESSINGS) ×1 IMPLANT
BOWL SMART MIX CTS (DISPOSABLE) ×1 IMPLANT
CEMENT HV SMART SET (Cement) ×2 IMPLANT
COVER SURGICAL LIGHT HANDLE (MISCELLANEOUS) ×1 IMPLANT
CUFF TRNQT CYL 34X4.125X (TOURNIQUET CUFF) ×1 IMPLANT
DERMABOND ADVANCED .7 DNX12 (GAUZE/BANDAGES/DRESSINGS) ×1 IMPLANT
DRAPE U-SHAPE 47X51 STRL (DRAPES) ×1 IMPLANT
DRSG AQUACEL AG ADV 3.5X10 (GAUZE/BANDAGES/DRESSINGS) ×1 IMPLANT
DURAPREP 26ML APPLICATOR (WOUND CARE) ×1 IMPLANT
ELECT REM PT RETURN 15FT ADLT (MISCELLANEOUS) ×1 IMPLANT
GLOVE BIO SURGEON STRL SZ 6.5 (GLOVE) IMPLANT
GLOVE BIO SURGEON STRL SZ8 (GLOVE) ×1 IMPLANT
GLOVE BIOGEL PI IND STRL 7.0 (GLOVE) ×1 IMPLANT
GLOVE BIOGEL PI IND STRL 8 (GLOVE) ×1 IMPLANT
GOWN STRL REUS W/ TWL LRG LVL3 (GOWN DISPOSABLE) ×1 IMPLANT
HOLDER FOLEY CATH W/STRAP (MISCELLANEOUS) ×1 IMPLANT
IMMOBILIZER KNEE 20 THIGH 36 (SOFTGOODS) ×1 IMPLANT
KIT TURNOVER KIT A (KITS) ×1 IMPLANT
MANIFOLD NEPTUNE II (INSTRUMENTS) ×1 IMPLANT
NS IRRIG 1000ML POUR BTL (IV SOLUTION) ×1 IMPLANT
PACK TOTAL KNEE CUSTOM (KITS) ×1 IMPLANT
PADDING CAST COTTON 6X4 STRL (CAST SUPPLIES) ×2 IMPLANT
PENCIL SMOKE EVACUATOR (MISCELLANEOUS) ×1 IMPLANT
PIN STEINMAN FIXATION KNEE (PIN) IMPLANT
PROTECTOR NERVE ULNAR (MISCELLANEOUS) ×1 IMPLANT
SET HNDPC FAN SPRY TIP SCT (DISPOSABLE) ×1 IMPLANT
SUT MNCRL AB 4-0 PS2 18 (SUTURE) ×1 IMPLANT
SUT STRATAFIX PDS+ 0 24IN (SUTURE) IMPLANT
SUT VIC AB 2-0 CT1 TAPERPNT 27 (SUTURE) ×3 IMPLANT
SUTURE STRATFX 0 PDS 27 VIOLET (SUTURE) ×1 IMPLANT
TOWEL GREEN STERILE FF (TOWEL DISPOSABLE) ×1 IMPLANT
TRAY FOLEY MTR SLVR 16FR STAT (SET/KITS/TRAYS/PACK) ×1 IMPLANT
TUBE SUCTION HIGH CAP CLEAR NV (SUCTIONS) ×1 IMPLANT
WRAP KNEE MAXI GEL POST OP (GAUZE/BANDAGES/DRESSINGS) ×1 IMPLANT

## 2024-08-19 NOTE — Anesthesia Postprocedure Evaluation (Signed)
 Anesthesia Post Note  Patient: Raymond [name]  Procedure(s) Performed: ARTHROPLASTY, KNEE, TOTAL (Right: Knee)     Patient location during evaluation: PACU Anesthesia Type: Regional and Spinal Level of consciousness: awake Pain management: pain level controlled Vital Signs Assessment: post-procedure vital signs reviewed and stable Respiratory status: spontaneous breathing, nonlabored ventilation and respiratory function stable Cardiovascular status: blood pressure returned to baseline and stable Postop Assessment: no apparent nausea or vomiting Anesthetic complications: no   No notable events documented.  Last Vitals:  Vitals:   08/19/24 1611 08/19/24 1811  BP: (!) 164/103 (!) 141/109  Pulse: 96 (!) 103  Resp: 18 16  Temp: 36.7 C 36.6 C  SpO2: 95% 94%    Last Pain:  Vitals:   08/19/24 1855  TempSrc:   PainSc: 4                  Zymiere Trostle P Philamena Kramar

## 2024-08-19 NOTE — Anesthesia Procedure Notes (Signed)
 Procedure Name: MAC Date/Time: 08/19/2024 11:56 AM  Performed by: Nada Corean CROME, CRNAPre-anesthesia Checklist: Patient identified, Emergency Drugs available, Suction available, Patient being monitored and Timeout performed Patient Re-evaluated:Patient Re-evaluated prior to induction Oxygen Delivery Method: Simple face mask Preoxygenation: Pre-oxygenation with 100% oxygen Induction Type: IV induction Ventilation: Nasal airway inserted- appropriate to patient size Placement Confirmation: positive ETCO2 Dental Injury: Teeth and Oropharynx as per pre-operative assessment

## 2024-08-19 NOTE — Evaluation (Signed)
 Physical Therapy Evaluation Patient Details Name: Raymond [name] MRN: [id number] DOB: 30-Dec-1955 Today's Date: 08/19/2024  History of Present Illness  Patient is 68 y.o. male whom presents to therapy s/p R  TKR on 08/19/2024 due to failure of conservative measures. Pt PMH includes but is not limited to: HTN, GERD, DM, OA, asthma, COPD, cervical spondylosis s/p ACDF, OSA, L TKA in 2019 and L TKA revision with polyexchange on 06/17/2020.  Clinical Impression      DALVIN CLIPPER is a 68 y.o. male POD 0 s/p R TKA. Patient reports IND with mobility at baseline. Patient is now limited by functional impairments (see PT problem list below) and requires S for bed mobility and CGA for transfers. Patient was able to ambulate 55 feet with RW and CGA level of assist. Patient instructed in exercise to facilitate ROM and circulation to manage edema.  Patient will benefit from continued skilled PT interventions to address impairments and progress towards PLOF. Acute PT will follow to progress mobility and stair training in preparation for safe discharge home with family support and OPPT services.     If plan is discharge home, recommend the following: A little help with walking and/or transfers;A little help with bathing/dressing/bathroom;Assistance with cooking/housework;Assist for transportation;Help with stairs or ramp for entrance   Can travel by private vehicle        Equipment Recommendations None recommended by PT  Recommendations for Other Services       Functional Status Assessment Patient has had a recent decline in their functional status and demonstrates the ability to make significant improvements in function in a reasonable and predictable amount of time.     Precautions / Restrictions Precautions Precautions: Knee;Fall Restrictions Weight Bearing Restrictions Per Provider Order: No      Mobility  Bed Mobility Overal bed mobility: Needs Assistance Bed Mobility: Supine to Sit      Supine to sit: Supervision, HOB elevated     General bed mobility comments: min cues    Transfers Overall transfer level: Needs assistance Equipment used: Rolling walker (2 wheels) Transfers: Sit to/from Stand Sit to Stand: Contact guard assist           General transfer comment: min cues    Ambulation/Gait Ambulation/Gait assistance: Contact guard assist Gait Distance (Feet): 55 Feet Assistive device: Rolling walker (2 wheels) Gait Pattern/deviations: Step-to pattern, Decreased stance time - right, Antalgic, Trunk flexed Gait velocity: decreased     General Gait Details: slight trunk flexion to offload R LE in stance phase with B UE support at RW min cues for safety, sequencing and Rw management  Stairs            Wheelchair Mobility     Tilt Bed    Modified Rankin (Stroke Patients Only)       Balance Overall balance assessment: Needs assistance Sitting-balance support: Feet supported Sitting balance-Leahy Scale: Good     Standing balance support: Bilateral upper extremity supported, Reliant on assistive device for balance, During functional activity Standing balance-Leahy Scale: Fair Standing balance comment: static standing no UE support                             Pertinent Vitals/Pain Pain Assessment Pain Assessment: 0-10 Pain Score: 4  Pain Location: R knee and LE Pain Descriptors / Indicators: Aching, Constant, Grimacing, Operative site guarding Pain Intervention(s): Limited activity within patient's tolerance, Monitored during session, Premedicated before session, Repositioned, Ice applied  Home Living Family/patient expects to be discharged to:: Private residence Living Arrangements: Spouse/significant other (Simultaneous filing. User may not have seen previous data.) Available Help at Discharge: Family Type of Home: House Home Access: Stairs to enter Entrance Stairs-Rails: Right;Left;Can reach both Entrance  Stairs-Number of Steps: 5   Home Layout: One level Home Equipment: Agricultural Consultant (2 wheels);Cane - single point;Crutches;Shower seat - built in      Prior Function Prior Level of Function : Independent/Modified Independent;Driving             Mobility Comments: IND no AD for all ADLs, self care tasks and IADLs       Extremity/Trunk Assessment        Lower Extremity Assessment Lower Extremity Assessment: RLE deficits/detail RLE Deficits / Details: ankle DF/PF 5/5; SLR < 10 degree lag RLE Sensation: WNL    Cervical / Trunk Assessment Cervical / Trunk Assessment: Neck Surgery  Communication   Communication Communication: No apparent difficulties    Cognition Arousal: Alert Behavior During Therapy: WFL for tasks assessed/performed   PT - Cognitive impairments: No apparent impairments                         Following commands: Intact       Cueing       General Comments      Exercises Total Joint Exercises Ankle Circles/Pumps: AROM, Both, 10 reps   Assessment/Plan    PT Assessment Patient needs continued PT services  PT Problem List Decreased strength;Decreased range of motion;Decreased activity tolerance;Decreased balance;Decreased mobility;Decreased coordination;Pain       PT Treatment Interventions DME instruction;Gait training;Stair training;Functional mobility training;Therapeutic activities;Therapeutic exercise;Balance training;Neuromuscular re-education;Patient/family education;Modalities    PT Goals (Current goals can be found in the Care Plan section)  Acute Rehab PT Goals Patient Stated Goal: to be able to do every thing I was before no pain, 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 PT Goal Formulation: With patient Time For Goal Achievement: 09/02/24 Potential to Achieve Goals: Good    Frequency 7X/week     Co-evaluation               AM-PAC PT 6 Clicks Mobility  Outcome Measure Help needed turning from your back  to your side while in a flat bed without using bedrails?: None Help needed moving from lying on your back to sitting on the side of a flat bed without using bedrails?: A Little Help needed moving to and from a bed to a chair (including a wheelchair)?: A Little Help needed standing up from a chair using your arms (e.g., wheelchair or bedside chair)?: A Little Help needed to walk in hospital room?: A Little Help needed climbing 3-5 steps with a railing? : Total 6 Click Score: 17    End of Session Equipment Utilized During Treatment: Gait belt Activity Tolerance: Patient tolerated treatment well;No increased pain Patient left: in chair;with call bell/phone within reach Nurse Communication: Mobility status PT Visit Diagnosis: Unsteadiness on feet (R26.81);Other abnormalities of gait and mobility (R26.89);Muscle weakness (generalized) (M62.81);Difficulty in walking, not elsewhere classified (R26.2);Pain Pain - Right/Left: Right Pain - part of body: Knee;Leg    Time: 8263-8194 PT Time Calculation (min) (ACUTE ONLY): 29 min   Charges:   PT Evaluation $PT Eval Low Complexity: 1 Low PT Treatments $Gait Training: 8-22 mins PT General Charges $$ ACUTE PT VISIT: 1 Visit         [place], PT Acute Rehab   [place]  H Dianely Krehbiel 08/19/2024, 6:45 PM

## 2024-08-19 NOTE — Transfer of Care (Signed)
 Immediate Anesthesia Transfer of Care Note  Patient: Raymond [name]  Procedure(s) Performed: ARTHROPLASTY, KNEE, TOTAL (Right: Knee)  Patient Location: PACU  Anesthesia Type:Spinal  Level of Consciousness: sedated  Airway & Oxygen Therapy: Patient Spontanous Breathing and Patient connected to face mask oxygen  Post-op Assessment: Report given to RN and Post -op Vital signs reviewed and stable  Post vital signs: Reviewed and stable  Last Vitals:  Vitals Value Taken Time  BP 106/85 08/19/24 13:30  Temp    Pulse 96 08/19/24 13:34  Resp 16 08/19/24 13:34  SpO2 92 % 08/19/24 13:34  Vitals shown include unfiled device data.  Last Pain:  Vitals:   08/19/24 0903  TempSrc:   PainSc: 6          Complications: No notable events documented.

## 2024-08-19 NOTE — Anesthesia Procedure Notes (Signed)
 Spinal  Patient location during procedure: OR Start time: 08/19/2024 11:40 AM End time: 08/19/2024 11:47 AM Reason for block: surgical anesthesia  Staffing Performed: anesthesiologist  Authorized by: Patrisha Bernardino SQUIBB, MD   Performed by: Patrisha Bernardino SQUIBB, MD  Preanesthetic Checklist Completed: patient identified, IV checked, risks and benefits discussed, surgical consent, monitors and equipment checked, pre-op evaluation and timeout performed Spinal Block Patient position: sitting Prep: DuraPrep Patient monitoring: cardiac monitor, continuous pulse ox and blood pressure Approach: midline Location: L4-5 Injection technique: single-shot Needle Needle type: Pencan  Needle gauge: 24 G Needle length: 9 cm Assessment Sensory level: T10 Events: CSF return  Additional Notes Functioning IV was confirmed and monitors were applied. Sterile prep and drape, including hand hygiene and sterile gloves were used. The patient was positioned and the spine was prepped. The skin was anesthetized with lidocaine .  Free flow of clear CSF was obtained prior to injecting local anesthetic into the CSF.  The spinal needle aspirated freely following injection.  The needle was carefully withdrawn.  The patient tolerated the procedure well.

## 2024-08-19 NOTE — Progress Notes (Signed)
 Orthopedic Tech Progress Note Patient Details:  Raymond [name] [birth year] [id number]  CPM Left Knee CPM Left Knee: Off Left Knee Flexion (Degrees): 40 Left Knee Extension (Degrees): 10 CPM Right Knee CPM Right Knee: On  Post Interventions Patient Tolerated: Well  Raymond [name] 08/19/2024, 2:16 PM

## 2024-08-19 NOTE — Discharge Instructions (Signed)
 Frank Aluisio, MD Total Joint Specialist [facility] [address] [place], [place] [address] [phone]  TOTAL KNEE REPLACEMENT POSTOPERATIVE DIRECTIONS    Knee Rehabilitation, Guidelines Following Surgery  Results after knee surgery are often greatly improved when you follow the exercise, range of motion and muscle strengthening exercises prescribed by your doctor. Safety measures are also important to protect the knee from further injury. If any of these exercises cause you to have increased pain or swelling in your knee joint, decrease the amount until you are comfortable again and slowly increase them. If you have problems or questions, call your caregiver or physical therapist for advice.   BLOOD CLOT PREVENTION Take an 81 mg Aspirin two times a day for three weeks following surgery. Then resume one 81 mg Aspirin once a day. You may resume your vitamins/supplements upon discharge from the hospital. Do not take any NSAIDs (Advil, Aleve, Ibuprofen, Meloxicam, etc.) until you are 3 weeks out from surgery  HOME CARE INSTRUCTIONS  Remove items at home which could result in a fall. This includes throw rugs or furniture in walking pathways.  ICE to the affected knee as much as tolerated. Icing helps control swelling. If the swelling is well controlled you will be more comfortable and rehab easier. Continue to use ice on the knee for pain and swelling from surgery. You may notice swelling that will progress down to the foot and ankle. This is normal after surgery. Elevate the leg when you are not up walking on it.    Continue to use the breathing machine which will help keep your temperature down. It is common for your temperature to cycle up and down following surgery, especially at night when you are not up moving around and exerting yourself. The breathing machine keeps your lungs expanded and your temperature down. Do not place pillow under the operative  knee, focus on keeping the knee straight while resting  DIET You may resume your previous home diet once you are discharged from the hospital.  DRESSING / WOUND CARE / SHOWERING Keep your bulky bandage on for 2 days. On the third post-operative day you may remove the Ace bandage and gauze. There is a waterproof adhesive bandage on your skin which will stay in place until your first follow-up appointment. Once you remove this you will not need to place another bandage You may begin showering 3 days following surgery, but do not submerge the incision under water.  ACTIVITY For the first 5 days, the key is rest and control of pain and swelling Do your home exercises twice a day starting on post-operative day 3. On the days you go to physical therapy, just do the home exercises once that day. You should rest, ice and elevate the leg for 50 minutes out of every hour. Get up and walk/stretch for 10 minutes per hour. After 5 days you can increase your activity slowly as tolerated. Walk with your walker as instructed. Use the walker until you are comfortable transitioning to a cane. Walk with the cane in the opposite hand of the operative leg. You may discontinue the cane once you are comfortable and walking steadily. Avoid periods of inactivity such as sitting longer than an hour when not asleep. This helps prevent blood clots.  You may discontinue the knee immobilizer once you are able to perform a straight leg raise while lying down. You may resume a sexual relationship in one month or when given the OK by   your doctor.  You may return to work once you are cleared by your doctor.  Do not drive a car for 6 weeks or until released by your surgeon.  Do not drive while taking narcotics.  TED HOSE STOCKINGS Wear the elastic stockings on both legs for three weeks following surgery during the day. You may remove them at night for sleeping.  WEIGHT BEARING Weight bearing as tolerated with assist device  (walker, cane, etc) as directed, use it as long as suggested by your surgeon or therapist, typically at least 4-6 weeks.  POSTOPERATIVE CONSTIPATION PROTOCOL Constipation - defined medically as fewer than three stools per week and severe constipation as less than one stool per week.  One of the most common issues patients have following surgery is constipation.  Even if you have a regular bowel pattern at home, your normal regimen is likely to be disrupted due to multiple reasons following surgery.  Combination of anesthesia, postoperative narcotics, change in appetite and fluid intake all can affect your bowels.  In order to avoid complications following surgery, here are some recommendations in order to help you during your recovery period.  Colace (docusate) - Pick up an over-the-counter form of Colace or another stool softener and take twice a day as long as you are requiring postoperative pain medications.  Take with a full glass of water daily.  If you experience loose stools or diarrhea, hold the colace until you stool forms back up. If your symptoms do not get better within 1 week or if they get worse, check with your doctor. Dulcolax (bisacodyl) - Pick up over-the-counter and take as directed by the product packaging as needed to assist with the movement of your bowels.  Take with a full glass of water.  Use this product as needed if not relieved by Colace only.  MiraLax (polyethylene glycol) - Pick up over-the-counter to have on hand. MiraLax is a solution that will increase the amount of water in your bowels to assist with bowel movements.  Take as directed and can mix with a glass of water, juice, soda, coffee, or tea. Take if you go more than two days without a movement. Do not use MiraLax more than once per day. Call your doctor if you are still constipated or irregular after using this medication for 7 days in a row.  If you continue to have problems with postoperative constipation, please  contact the office for further assistance and recommendations.  If you experience "the worst abdominal pain ever" or develop nausea or vomiting, please contact the office immediatly for further recommendations for treatment.  ITCHING If you experience itching with your medications, try taking only a single pain pill, or even half a pain pill at a time.  You can also use Benadryl over the counter for itching or also to help with sleep.   MEDICATIONS See your medication summary on the "After Visit Summary" that the nursing staff will review with you prior to discharge.  You may have some home medications which will be placed on hold until you complete the course of blood thinner medication.  It is important for you to complete the blood thinner medication as prescribed by your surgeon.  Continue your approved medications as instructed at time of discharge.  PRECAUTIONS If you experience chest pain or shortness of breath - call 911 immediately for transfer to the hospital emergency department.  If you develop a fever greater that 101 F, purulent drainage from wound, increased redness   or drainage from wound, foul odor from the wound/dressing, or calf pain - CONTACT YOUR SURGEON.                                                   FOLLOW-UP APPOINTMENTS Make sure you keep all of your appointments after your operation with your surgeon and caregivers. You should call the office at the above phone number and make an appointment for approximately two weeks after the date of your surgery or on the date instructed by your surgeon outlined in the "After Visit Summary".  RANGE OF MOTION AND STRENGTHENING EXERCISES  Rehabilitation of the knee is important following a knee injury or an operation. After just a few days of immobilization, the muscles of the thigh which control the knee become weakened and shrink (atrophy). Knee exercises are designed to build up the tone and strength of the thigh muscles and to  improve knee motion. Often times heat used for twenty to thirty minutes before working out will loosen up your tissues and help with improving the range of motion but do not use heat for the first two weeks following surgery. These exercises can be done on a training (exercise) mat, on the floor, on a table or on a bed. Use what ever works the best and is most comfortable for you Knee exercises include:  Leg Lifts - While your knee is still immobilized in a splint or cast, you can do straight leg raises. Lift the leg to 60 degrees, hold for 3 sec, and slowly lower the leg. Repeat 10-20 times 2-3 times daily. Perform this exercise against resistance later as your knee gets better.  Quad and Hamstring Sets - Tighten up the muscle on the front of the thigh (Quad) and hold for 5-10 sec. Repeat this 10-20 times hourly. Hamstring sets are done by pushing the foot backward against an object and holding for 5-10 sec. Repeat as with quad sets.  Leg Slides: Lying on your back, slowly slide your foot toward your buttocks, bending your knee up off the floor (only go as far as is comfortable). Then slowly slide your foot back down until your leg is flat on the floor again. Angel Wings: Lying on your back spread your legs to the side as far apart as you can without causing discomfort.  A rehabilitation program following serious knee injuries can speed recovery and prevent re-injury in the future due to weakened muscles. Contact your doctor or a physical therapist for more information on knee rehabilitation.   POST-OPERATIVE OPIOID TAPER INSTRUCTIONS: It is important to wean off of your opioid medication as soon as possible. If you do not need pain medication after your surgery it is ok to stop day one. Opioids include: Codeine, Hydrocodone(Norco, Vicodin), Oxycodone(Percocet, oxycontin) and hydromorphone amongst others.  Long term and even short term use of opiods can cause: Increased pain  response Dependence Constipation Depression Respiratory depression And more.  Withdrawal symptoms can include Flu like symptoms Nausea, vomiting And more Techniques to manage these symptoms Hydrate well Eat regular healthy meals Stay active Use relaxation techniques(deep breathing, meditating, yoga) Do Not substitute Alcohol to help with tapering If you have been on opioids for less than two weeks and do not have pain than it is ok to stop all together.  Plan to wean off of opioids This   plan should start within one week post op of your joint replacement. Maintain the same interval or time between taking each dose and first decrease the dose.  Cut the total daily intake of opioids by one tablet each day Next start to increase the time between doses. The last dose that should be eliminated is the evening dose.   IF YOU ARE TRANSFERRED TO A SKILLED REHAB FACILITY If the patient is transferred to a skilled rehab facility following release from the hospital, a list of the current medications will be sent to the facility for the patient to continue.  When discharged from the skilled rehab facility, please have the facility set up the patient's Home Health Physical Therapy prior to being released. Also, the skilled facility will be responsible for providing the patient with their medications at time of release from the facility to include their pain medication, the muscle relaxants, and their blood thinner medication. If the patient is still at the rehab facility at time of the two week follow up appointment, the skilled rehab facility will also need to assist the patient in arranging follow up appointment in our office and any transportation needs.  MAKE SURE YOU:  Understand these instructions.  Get help right away if you are not doing well or get worse.   DENTAL ANTIBIOTICS:  In most cases prophylactic antibiotics for Dental procdeures after total joint surgery are not  necessary.  Exceptions are as follows:  1. History of prior total joint infection  2. Severely immunocompromised (Organ Transplant, cancer chemotherapy, Rheumatoid biologic meds such as Humera)  3. Poorly controlled diabetes (A1C &gt; 8.0, blood glucose over 200)  If you have one of these conditions, contact your surgeon for an antibiotic prescription, prior to your dental procedure.    Pick up stool softner and laxative for home use following surgery while on pain medications. Do not submerge incision under water. Please use good hand washing techniques while changing dressing each day. May shower starting three days after surgery. Please use a clean towel to pat the incision dry following showers. Continue to use ice for pain and swelling after surgery. Do not use any lotions or creams on the incision until instructed by your surgeon.  

## 2024-08-19 NOTE — Interval H&P Note (Signed)
 History and Physical Interval Note:  08/19/2024 9:24 AM  Raymond [name]  has presented today for surgery, with the diagnosis of Right knee osteoarthritis.  The various methods of treatment have been discussed with the patient and family. After consideration of risks, benefits and other options for treatment, the patient has consented to  Procedures: ARTHROPLASTY, KNEE, TOTAL (Right) as a surgical intervention.  The patient's history has been reviewed, patient examined, no change in status, stable for surgery.  I have reviewed the patient's chart and labs.  Questions were answered to the patient's satisfaction.     [name]

## 2024-08-19 NOTE — Anesthesia Procedure Notes (Signed)
 Anesthesia Regional Block: Adductor canal block   Pre-Anesthetic Checklist: , timeout performed,  Correct Patient, Correct Site, Correct Laterality,  Correct Procedure,, site marked,  Risks and benefits discussed,  Surgical consent,  Pre-op evaluation,  At surgeon's request and post-op pain management  Laterality: Right  Prep: chloraprep       Needles:  Injection technique: Single-shot  Needle Type: Echogenic Stimulator Needle     Needle Length: 10cm  Needle Gauge: 20     Additional Needles:   Procedures:,,,, ultrasound used (permanent image in chart),,    Narrative:  Start time: 08/19/2024 10:50 AM End time: 08/19/2024 11:00 AM Injection made incrementally with aspirations every 5 mL.  Performed by: Personally  Anesthesiologist: Patrisha Bernardino SQUIBB, MD  Additional Notes: Functioning IV was confirmed and monitors were applied. A time-out was performed. Hand hygiene and sterile gloves were used. The thigh was placed in a frog-leg position and prepped in a sterile fashion. A 20ga Bbraun echogenic stimulator needle was placed using ultrasound guidance.  Negative aspiration and negative test dose prior to incremental administration of local anesthetic. The patient tolerated the procedure well.

## 2024-08-19 NOTE — Op Note (Signed)
 OPERATIVE REPORT-TOTAL KNEE ARTHROPLASTY   Pre-operative diagnosis- Osteoarthritis  Right knee(s)  Post-operative diagnosis- Osteoarthritis Right knee(s)  Procedure-  Right  Total Knee Arthroplasty  Surgeon- Dempsey GAILS. Oreta Soloway, MD  Assistant- Waddell Sor, PA-C   Anesthesia-  Adductor canal block and spinal  EBL- 25 ml   Drains None  Tourniquet time-  Total Tourniquet Time Documented: Thigh (Right) - 50 minutes Total: Thigh (Right) - 50 minutes     Complications- None  Condition-PACU - hemodynamically stable.   Brief Clinical Note  Raymond [name] is a 68 y.o. year old male with end stage OA of his right knee with progressively worsening pain and dysfunction. He has constant pain, with activity and at rest and significant functional deficits with difficulties even with ADLs. He has had extensive non-op management including analgesics, injections of cortisone and viscosupplements, and home exercise program, but remains in significant pain with significant dysfunction. Radiographs show bone on bone arthritis medial and patellofemoral. He presents now for right Total Knee Arthroplasty.     Procedure in detail---   The patient is brought into the operating room and positioned supine on the operating table. After successful administration of  Adductor canal block and spinal,   a tourniquet is placed high on the  Right thigh(s) and the lower extremity is prepped and draped in the usual sterile fashion. Time out is performed by the operating team and then the  Right lower extremity is wrapped in Esmarch, knee flexed and the tourniquet inflated to 300 mmHg.       A midline incision is made with a ten blade through the subcutaneous tissue to the level of the extensor mechanism. A fresh blade is used to make a medial parapatellar arthrotomy. Soft tissue over the proximal medial tibia is subperiosteally elevated to the joint line with a knife and into the semimembranosus bursa with a Cobb  elevator. Soft tissue over the proximal lateral tibia is elevated with attention being paid to avoiding the patellar tendon on the tibial tubercle. The patella is everted, knee flexed 90 degrees and the ACL and PCL are removed. Findings are bone on bone medial and patellofemoral with large global osteophytes        The drill is used to create a starting hole in the distal femur and the canal is thoroughly irrigated with sterile saline to remove the fatty contents. The 5 degree Right  valgus alignment guide is placed into the femoral canal and the distal femoral cutting block is pinned to remove 10 mm off the distal femur. Resection is made with an oscillating saw.      The tibia is subluxed forward and the menisci are removed. The extramedullary alignment guide is placed referencing proximally at the medial aspect of the tibial tubercle and distally along the second metatarsal axis and tibial crest. The block is pinned to remove 2mm off the more deficient medial  side. Resection is made with an oscillating saw. Size 8is the most appropriate size for the tibia and the proximal tibia is prepared with the modular drill and keel punch for that size.      The femoral sizing guide is placed and size 9 is most appropriate. Rotation is marked off the epicondylar axis and confirmed by creating a rectangular flexion gap at 90 degrees. The size 9 cutting block is pinned in this rotation and the anterior, posterior and chamfer cuts are made with the oscillating saw. The intercondylar block is then placed and that cut is  made.      Trial size 8 tibial component, trial size 9 posterior stabilized femur and a 8  mm posterior stabilized rotating platform insert trial is placed. Full extension is achieved with excellent varus/valgus and anterior/posterior balance throughout full range of motion. The patella is everted and thickness measured to be 27  mm. Free hand resection is taken to 15 mm, a 41 template is placed, lug holes  are drilled, trial patella is placed, and it tracks normally. Osteophytes are removed off the posterior femur with the trial in place. All trials are removed and the cut bone surfaces prepared with pulsatile lavage. Cement is mixed and once ready for implantation, the size 8 tibial implant, size  9 posterior stabilized femoral component, and the size 41 patella are cemented in place and the patella is held with the clamp. The trial insert is placed and the knee held in full extension. The Exparel  (20 ml mixed with 60 ml saline) is injected into the extensor mechanism, posterior capsule, medial and lateral gutters and subcutaneous tissues.  All extruded cement is removed and once the cement is hard the permanent 8 mm posterior stabilized rotating platform insert is placed into the tibial tray.      The wound is copiously irrigated with saline solution and the extensor mechanism closed with # 0 Stratofix suture. The tourniquet is released for a total tourniquet time of 50  minutes. Flexion against gravity is 140 degrees and the patella tracks normally. Subcutaneous tissue is closed with 2.0 vicryl and subcuticular with running 4.0 Monocryl. The incision is cleaned and dried and steri-strips and a bulky sterile dressing are applied. The limb is placed into a knee immobilizer and the patient is awakened and transported to recovery in stable condition.      Please note that a surgical assistant was a medical necessity for this procedure in order to perform it in a safe and expeditious manner. Surgical assistant was necessary to retract the ligaments and vital neurovascular structures to prevent injury to them and also necessary for proper positioning of the limb to allow for anatomic placement of the prosthesis.   Dempsey ROCKFORD Tal Neer, MD    08/19/2024, 1:05 PM

## 2024-08-19 NOTE — Progress Notes (Signed)
 Orthopedic Tech Progress Note Patient Details:  Raymond [name] 03-26-1956 [id number]  CPM removed from RLE in 3W36.  CPM Left Knee CPM Left Knee: Off Left Knee Flexion (Degrees): 40 Left Knee Extension (Degrees): 10 CPM Right Knee CPM Right Knee: Off Right Knee Flexion (Degrees): 40 Right Knee Extension (Degrees): 10  Post Interventions Patient Tolerated: Well  [name] 08/19/2024, 4:06 PM

## 2024-08-20 ENCOUNTER — Encounter (HOSPITAL_COMMUNITY): Payer: Self-pay | Admitting: Orthopedic Surgery

## 2024-08-20 ENCOUNTER — Other Ambulatory Visit (HOSPITAL_COMMUNITY): Payer: Self-pay

## 2024-08-20 DIAGNOSIS — M1711 Unilateral primary osteoarthritis, right knee: Secondary | ICD-10-CM | POA: Diagnosis not present

## 2024-08-20 LAB — BASIC METABOLIC PANEL WITH GFR
Anion gap: 10 (ref 5–15)
BUN: 13 mg/dL (ref 8–23)
CO2: 26 mmol/L (ref 22–32)
Calcium: 9 mg/dL (ref 8.9–10.3)
Chloride: 103 mmol/L (ref 98–111)
Creatinine, Ser: 0.9 mg/dL (ref 0.61–1.24)
GFR, Estimated: >60 mL/min (ref >60)
Glucose, Bld: 166 mg/dL — ABNORMAL HIGH (ref 70–99)
Potassium: 4.3 mmol/L (ref 3.5–5.1)
Sodium: 139 mmol/L (ref 135–145)

## 2024-08-20 LAB — CBC
HCT: 40.5 % (ref 39.0–52.0)
Hemoglobin: 13.5 g/dL (ref 13.0–17.0)
MCH: 31 pg (ref 26.0–34.0)
MCHC: 33.3 g/dL (ref 30.0–36.0)
MCV: 93.1 fL (ref 80.0–100.0)
Platelets: 308 K/uL (ref 150–400)
RBC: 4.35 MIL/uL (ref 4.22–5.81)
RDW: 13.4 % (ref 11.5–15.5)
WBC: 18 K/uL — ABNORMAL HIGH (ref 4.0–10.5)
nRBC: 0 % (ref 0.0–0.2)

## 2024-08-20 LAB — GLUCOSE, CAPILLARY: Glucose-Capillary: 174 mg/dL — ABNORMAL HIGH (ref 70–99)

## 2024-08-20 MED ORDER — ASPIRIN 81 MG PO CHEW
81.0000 mg | CHEWABLE_TABLET | Freq: Two times a day (BID) | ORAL | 0 refills | Status: AC
  Filled 2024-08-20: qty 42, 21d supply, fill #0

## 2024-08-20 MED ORDER — OXYCODONE HCL 5 MG PO TABS
5.0000 mg | ORAL_TABLET | Freq: Four times a day (QID) | ORAL | 0 refills | Status: AC | PRN
  Filled 2024-08-20: qty 42, 6d supply, fill #0

## 2024-08-20 MED ORDER — TRAMADOL HCL 50 MG PO TABS
50.0000 mg | ORAL_TABLET | Freq: Four times a day (QID) | ORAL | 0 refills | Status: AC | PRN
  Filled 2024-08-20: qty 40, 5d supply, fill #0

## 2024-08-20 MED ORDER — METHOCARBAMOL 500 MG PO TABS
500.0000 mg | ORAL_TABLET | Freq: Four times a day (QID) | ORAL | 0 refills | Status: AC | PRN
  Filled 2024-08-20: qty 40, 10d supply, fill #0

## 2024-08-20 MED ORDER — GABAPENTIN 300 MG PO CAPS
ORAL_CAPSULE | ORAL | 0 refills | Status: AC
  Filled 2024-08-20: qty 84, 42d supply, fill #0

## 2024-08-20 MED ORDER — ONDANSETRON HCL 4 MG PO TABS
4.0000 mg | ORAL_TABLET | Freq: Four times a day (QID) | ORAL | 0 refills | Status: AC | PRN
  Filled 2024-08-20: qty 20, 5d supply, fill #0

## 2024-08-20 NOTE — Progress Notes (Signed)
 Discharge medications delivered to patient at the bedside.

## 2024-08-20 NOTE — Plan of Care (Signed)
°  Problem: Pain Management: Goal: Pain level will decrease with appropriate interventions Outcome: Progressing   Problem: Clinical Measurements: Goal: Postoperative complications will be avoided or minimized Outcome: Progressing   Problem: Education: Goal: Knowledge of the prescribed therapeutic regimen will improve Outcome: Progressing   Problem: Metabolic: Goal: Ability to maintain appropriate glucose levels will improve Outcome: Progressing   Problem: Safety: Goal: Ability to remain free from injury will improve Outcome: Progressing

## 2024-08-20 NOTE — Progress Notes (Signed)
 Physical Therapy Treatment Patient Details Name: Raymond [name] MRN: [id number] DOB: March 23, 1956 Today's Date: 08/20/2024   History of Present Illness Patient is 68 y.o. male whom presents to therapy s/p R  TKR on 08/19/2024 due to failure of conservative measures. Pt PMH includes but is not limited to: HTN, GERD, DM, OA, asthma, COPD, cervical spondylosis s/p ACDF, OSA, L TKA in 2019 and L TKA revision with polyexchange on 06/17/2020.    PT Comments  Patient eager to progress to DC ASAP. Patient well versed  in TKA recovery from previous L knee surgeries. Reviewed ambulation protocol, and HEP until OPPT appt on  12/19.  Right knee stable with no need for KI, practiced steps. Patient has met PT goals for DC.    If plan is discharge home, recommend the following: A little help with walking and/or transfers;A little help with bathing/dressing/bathroom;Assistance with cooking/housework;Assist for transportation;Help with stairs or ramp for entrance   Can travel by private vehicle        Equipment Recommendations  None recommended by PT    Recommendations for Other Services       Precautions / Restrictions Precautions Precautions: Knee;Fall     Mobility  Bed Mobility Overal bed mobility: Independent                  Transfers Overall transfer level: Needs assistance Equipment used: Rolling walker (2 wheels) Transfers: Sit to/from Stand Sit to Stand: Supervision           General transfer comment: cues for RLE position    Ambulation/Gait Ambulation/Gait assistance: Contact guard assist Gait Distance (Feet): 80 Feet Assistive device: Rolling walker (2 wheels) Gait Pattern/deviations: Step-through pattern, Antalgic Gait velocity: decreased     General Gait Details: cues for sequence, caution for  R knee instability   Stairs Stairs: Yes Stairs assistance: Contact guard assist Stair Management: One rail Left, Forwards, With cane Number of Stairs: 2 General  stair comments: cues for equence, R knee did not buckle, cue  for caution of bickling, esp when descending.   Wheelchair Mobility     Tilt Bed    Modified Rankin (Stroke Patients Only)       Balance Overall balance assessment: Mild deficits observed, not formally tested                                          Communication Communication Communication: No apparent difficulties  Cognition Arousal: Alert Behavior During Therapy: WFL for tasks assessed/performed   PT - Cognitive impairments: No apparent impairments                                Cueing    Exercises Total Joint Exercises Quad Sets: AROM, Both Straight Leg Raises: AROM, Right, 5 reps Long Arc Quad: AROM, Right, 5 reps Knee Flexion: AROM, Right, 5 reps Goniometric ROM: 10-60 right nee flex    General Comments        Pertinent Vitals/Pain Pain Assessment Pain Score: 4  Pain Location: R knee and LE, thigh Pain Descriptors / Indicators: Aching, Constant, Grimacing, Operative site guarding Pain Intervention(s): Monitored during session, Premedicated before session    Home Living                          Prior Function  PT Goals (current goals can now be found in the care plan section) Progress towards PT goals: Progressing toward goals    Frequency    7X/week      PT Plan      Co-evaluation              AM-PAC PT 6 Clicks Mobility   Outcome Measure  Help needed turning from your back to your side while in a flat bed without using bedrails?: None Help needed moving from lying on your back to sitting on the side of a flat bed without using bedrails?: None Help needed moving to and from a bed to a chair (including a wheelchair)?: A Little Help needed standing up from a chair using your arms (e.g., wheelchair or bedside chair)?: A Little Help needed to walk in hospital room?: A Little Help needed climbing 3-5 steps with a railing?  : A Little 6 Click Score: 20    End of Session Equipment Utilized During Treatment: Gait belt Activity Tolerance: Patient tolerated treatment well;No increased pain Patient left: in chair;with call bell/phone within reach Nurse Communication: Mobility status PT Visit Diagnosis: Unsteadiness on feet (R26.81);Other abnormalities of gait and mobility (R26.89);Muscle weakness (generalized) (M62.81);Difficulty in walking, not elsewhere classified (R26.2);Pain Pain - Right/Left: Right Pain - part of body: Knee;Leg     Time: 0813-0827 PT Time Calculation (min) (ACUTE ONLY): 14 min  Charges:    $Gait Training: 8-22 mins PT General Charges $$ ACUTE PT VISIT: 1 Visit                    Darice Potters PT Acute Rehabilitation Services Office [phone]   Potters Darice Norris 08/20/2024, 8:38 AM

## 2024-08-20 NOTE — Progress Notes (Signed)
° °  Subjective: 1 Day Post-Op Procedures (LRB): ARTHROPLASTY, KNEE, TOTAL (Right) Patient reports pain as moderate.   Patient seen in rounds by Dr. Melodi. Patient had issues with pain control with previous TKA, seems to be better controlled this morning. Denies chest pain, SOB, or calf pain. Foley catheter removed this AM.  We will continue therapy today, ambulated 70' yesterday.   Objective: Vital signs in last 24 hours: Temp:  [97.4 F (36.3 C)-98.3 F (36.8 C)] 97.4 F (36.3 C) (12/16 0650) Pulse Rate:  [74-103] 85 (12/16 0650) Resp:  [14-19] 18 (12/16 0650) BP: (106-169)/(84-109) 169/98 (12/16 0650) SpO2:  [92 %-98 %] 98 % (12/16 0650) Weight:  [118.8 kg] 118.8 kg (12/15 0903)  Intake/Output from previous day:  Intake/Output Summary (Last 24 hours) at 08/20/2024 0829 Last data filed at 08/20/2024 0630 Gross per 24 hour  Intake 3230.32 ml  Output 5350 ml  Net -2119.68 ml     Intake/Output this shift: No intake/output data recorded.  Labs: Recent Labs    08/20/24 0348  HGB 13.5   Recent Labs    08/20/24 0348  WBC 18.0*  RBC 4.35  HCT 40.5  PLT 308   Recent Labs    08/19/24 0920 08/20/24 0348  NA 138 139  K 3.8 4.3  CL 100 103  CO2 25 26  BUN 11 13  CREATININE 0.90 0.90  GLUCOSE 128* 166*  CALCIUM  9.5 9.0   No results for input(s): LABPT, INR in the last 72 hours.  Exam: General - Patient is Alert and Oriented Extremity - Neurologically intact Neurovascular intact Sensation intact distally Dorsiflexion/Plantar flexion intact Dressing - dressing C/D/I Motor Function - intact, moving foot and toes well on exam.   Past Medical History:  Diagnosis Date   Arthritis    Asbestos exposure    Asthma    COPD (chronic obstructive pulmonary disease) (HCC)    Diabetes mellitus without complication (HCC)    type 2    GERD (gastroesophageal reflux disease)    Hypertension    Muscle spasms of neck    Seasonal allergies    Sleep apnea    cpap-  does not know settings     Assessment/Plan: 1 Day Post-Op Procedures (LRB): ARTHROPLASTY, KNEE, TOTAL (Right) Principal Problem:   OA (osteoarthritis) of knee Active Problems:   Primary osteoarthritis of right knee  Estimated body mass index is 33.64 kg/m as calculated from the following:   Height as of this encounter: 6' 2 (1.88 m).   Weight as of this encounter: 118.8 kg. Advance diet Up with therapy D/C IV fluids   Patient's anticipated LOS is less than 2 midnights, meeting these requirements: - Lives within 1 hour of care - Has a competent adult at home to recover with post-op recover - NO history of  - Diabetes  - Coronary Artery Disease  - Heart failure  - Heart attack  - Stroke  - DVT/VTE  - Cardiac arrhythmia  - Respiratory Failure/COPD  - Renal failure  - Anemia  - Advanced Liver disease  DVT Prophylaxis - Aspirin  Weight bearing as tolerated. Continue therapy.  Plan is to go Home after hospital stay. Plan for discharge later today if progresses with therapy and meeting goals. Scheduled for OPP[facility] at [facility]. Follow-up in the office in 2 weeks.  The PDMP database was reviewed today prior to any opioid medications being prescribed to this patient.  Roxie Mess, PA-C Orthopedic Surgery [phone] 08/20/2024, 8:29 AM

## 2024-08-20 NOTE — TOC Transition Note (Signed)
 Transition of Care [facility]) - Discharge Note   Patient Details  Name: RAY GERVASI MRN: [id number] Date of Birth: 09-Jan-1956  [facility] CM/SW Contact:  Alfonse JONELLE Rex, RN Phone Number: 08/20/2024, 9:28 AM   Clinical Narrative:   Met with patient at bedside to review dc therapy and home equipment needs, patient confirmed OPPT-[facility], has RW. No INPT CM needs.     Final next level of care: OP Rehab Barriers to Discharge: No Barriers Identified   Patient Goals and CMS Choice Patient states their goals for this hospitalization and ongoing recovery are:: return home          Discharge Placement                       Discharge Plan and Services Additional resources added to the After Visit Summary for                                       Social Drivers of Health (SDOH) Interventions SDOH Screenings   Food Insecurity: No Food Insecurity (08/19/2024)  Housing: Low Risk (08/19/2024)  Transportation Needs: No Transportation Needs (08/19/2024)  Utilities: Not At Risk (08/19/2024)  Financial Resource Strain: Low Risk  (07/09/2024)   Received from [facility]  Social Connections: Socially Integrated (08/19/2024)  Tobacco Use: Medium Risk (08/19/2024)     Readmission Risk Interventions     No data to display

## 2024-08-21 NOTE — Discharge Summary (Signed)
 Patient ID: Raymond [name] MRN: [id number] DOB/AGE: Nov 22, 1955 68 y.o.  Admit date: 08/19/2024 Discharge date: 08/20/2024  Admission Diagnoses:  Principal Problem:   OA (osteoarthritis) of knee Active Problems:   Primary osteoarthritis of right knee   Discharge Diagnoses:  Same  Past Medical History:  Diagnosis Date   Arthritis    Asbestos exposure    Asthma    COPD (chronic obstructive pulmonary disease) (HCC)    Diabetes mellitus without complication (HCC)    type 2    GERD (gastroesophageal reflux disease)    Hypertension    Muscle spasms of neck    Seasonal allergies    Sleep apnea    cpap- does not know settings     Surgeries: Procedures: ARTHROPLASTY, KNEE, TOTAL on 08/19/2024   Consultants:   Discharged Condition: Improved  Hospital Course: Raymond [name] is an 68 y.o. male who was admitted 08/19/2024 for operative treatment ofOA (osteoarthritis) of knee. Patient has severe unremitting pain that affects sleep, daily activities, and work/hobbies. After pre-op clearance the patient was taken to the operating room on 08/19/2024 and underwent  Procedures: ARTHROPLASTY, KNEE, TOTAL.    Patient was given perioperative antibiotics:  Anti-infectives (From admission, onward)    Start     Dose/Rate Route Frequency Ordered Stop   08/19/24 1800  ceFAZolin  (ANCEF ) IVPB 2g/100 mL premix        2 g 200 mL/hr over 30 Minutes Intravenous Every 6 hours 08/19/24 1608 08/20/24 0050   08/19/24 1030  ceFAZolin  (ANCEF ) IVPB 2g/100 mL premix        2 g 200 mL/hr over 30 Minutes Intravenous On call to O.R. 08/19/24 0853 08/19/24 1218        Patient was given sequential compression devices, early ambulation, and chemoprophylaxis to prevent DVT.  Patient benefited maximally from hospital stay and there were no complications.    Recent vital signs: No data found.   Recent laboratory studies:  Recent Labs    08/19/24 0920 08/20/24 0348  WBC  --  18.0*  HGB  --  13.5   HCT  --  40.5  PLT  --  308  NA 138 139  K 3.8 4.3  CL 100 103  CO2 25 26  BUN 11 13  CREATININE 0.90 0.90  GLUCOSE 128* 166*  CALCIUM  9.5 9.0     Discharge Medications:   Allergies as of 08/20/2024       Reactions   Irbesartan Other (See Comments)   Sun sensitivity/skin burning        Medication List     STOP taking these medications    aspirin  EC 81 MG tablet Replaced by: Aspirin  Low Dose 81 MG chewable tablet   cyclobenzaprine  10 MG tablet Commonly known as: FLEXERIL    docusate sodium  100 MG capsule Commonly known as: COLACE   fluticasone  50 MCG/ACT nasal spray Commonly known as: FLONASE    HYDROcodone -acetaminophen  10-325 MG tablet Commonly known as: NORCO   lisinopril -hydrochlorothiazide  20-25 MG tablet Commonly known as: ZESTORETIC    OASIS TEARS PF OP   sucralfate  1 g tablet Commonly known as: CARAFATE        TAKE these medications    albuterol  108 (90 Base) MCG/ACT inhaler Commonly known as: VENTOLIN  HFA Inhale 2 puffs into the lungs every 4 (four) hours as needed for wheezing or shortness of breath.   amLODipine  10 MG tablet Commonly known as: NORVASC  Take 10 mg by mouth daily.   Aspirin  Low Dose 81 MG chewable tablet Generic  drug: aspirin  Chew 1 tablet (81 mg total) by mouth 2 (two) times daily for 21 days. Then resume one 81 mg aspirin  once a day Replaces: aspirin  EC 81 MG tablet   atorvastatin  40 MG tablet Commonly known as: LIPITOR Take 40 mg by mouth daily.   caffeine 200 MG Tabs tablet Take 200 mg by mouth daily as needed (energy).   chlorhexidine  4 % external liquid Commonly known as: HIBICLENS  Apply 15 mLs (1 Application total) topically as directed for 30 doses. Use as directed daily for 5 days every other week for 6 weeks.   EPINEPHrine  0.3 mg/0.3 mL Soaj injection Commonly known as: EPI-PEN Inject 0.3 mg into the muscle as needed for anaphylaxis.   gabapentin  300 MG capsule Commonly known as: NEURONTIN  Take 1  capsule by mouth 3 times a day for 2 weeks following surgery. Then take 1 capsule twice daily for 2 weeks. Then take 1 capsule once a day for 2 weeks. Then discontinue.   hydrocortisone valerate cream 0.2 % Commonly known as: WESTCORT Apply 1 application topically 2 (two) times daily as needed (skin irritation/rash.).   metFORMIN  500 MG tablet Commonly known as: GLUCOPHAGE  Take 500 mg by mouth 2 (two) times daily with a meal.   methocarbamol  500 MG tablet Commonly known as: ROBAXIN  Take 1 tablet (500 mg total) by mouth every 6 (six) hours as needed for muscle spasms.   montelukast  10 MG tablet Commonly known as: SINGULAIR  Take 10 mg by mouth at bedtime.   mupirocin  ointment 2 % Commonly known as: BACTROBAN  Place 1 Application into the nose 2 (two) times daily for 60 doses. Use as directed 2 times daily for 5 days every other week for 6 weeks.   omeprazole 40 MG capsule Commonly known as: PRILOSEC Take 40 mg by mouth daily before breakfast.   ondansetron  4 MG tablet Commonly known as: ZOFRAN  Take 1 tablet (4 mg total) by mouth every 6 (six) hours as needed for nausea.   oxyCODONE  5 MG immediate release tablet Commonly known as: Oxy IR/ROXICODONE  Take 1-2 tablets (5-10 mg total) by mouth every 6 (six) hours as needed for severe pain (pain score 7-10) or moderate pain (pain score 4-6). **DO NOT TAKE WITH HYDROCODONE  10/325**   Semaglutide (2 MG/DOSE) 8 MG/3ML Sopn Inject 2 mg into the skin once a week.   traMADol  50 MG tablet Commonly known as: ULTRAM  Take 1-2 tablets (50-100 mg total) by mouth every 6 (six) hours as needed (pain score 4-6).   Trelegy Ellipta 100-62.5-25 MCG/INH Aepb Generic drug: Fluticasone -Umeclidin-Vilant Inhale 1 puff into the lungs daily.               Discharge Care Instructions  (From admission, onward)           Start     Ordered   08/20/24 0000  Weight bearing as tolerated        08/20/24 0834   08/20/24 0000  Change dressing        Comments: You may remove the bulky bandage ([name] wrap and gauze) two days after surgery. You will have an adhesive waterproof bandage underneath. Leave this in place until your first follow-up appointment.   08/20/24 0834            Diagnostic Studies: No results found.  Disposition: Discharge disposition: 01-Home or Self Care       Discharge Instructions     Call MD / Call 911   Complete by: As directed  If you experience chest pain or shortness of breath, CALL 911 and be transported to the hospital emergency room.  If you develope a fever above 101 F, pus (white drainage) or increased drainage or redness at the wound, or calf pain, call your surgeon's office.   Change dressing   Complete by: As directed    You may remove the bulky bandage ([name] wrap and gauze) two days after surgery. You will have an adhesive waterproof bandage underneath. Leave this in place until your first follow-up appointment.   Constipation Prevention   Complete by: As directed    Drink plenty of fluids.  Prune juice may be helpful.  You may use a stool softener, such as Colace (over the counter) 100 mg twice a day.  Use MiraLax  (over the counter) for constipation as needed.   Diet - low sodium heart healthy   Complete by: As directed    Do not put a pillow under the knee. Place it under the heel.   Complete by: As directed    Driving restrictions   Complete by: As directed    No driving for two weeks   Post-operative opioid taper instructions:   Complete by: As directed    POST-OPERATIVE OPIOID TAPER INSTRUCTIONS: It is important to wean off of your opioid medication as soon as possible. If you do not need pain medication after your surgery it is ok to stop day one. Opioids include: Codeine, Hydrocodone (Norco, Vicodin), Oxycodone (Percocet, oxycontin ) and hydromorphone  amongst others.  Long term and even short term use of opiods can cause: Increased pain  response Dependence Constipation Depression Respiratory depression And more.  Withdrawal symptoms can include Flu like symptoms Nausea, vomiting And more Techniques to manage these symptoms Hydrate well Eat regular healthy meals Stay active Use relaxation techniques(deep breathing, meditating, yoga) Do Not substitute Alcohol  to help with tapering If you have been on opioids for less than two weeks and do not have pain than it is ok to stop all together.  Plan to wean off of opioids This plan should start within one week post op of your joint replacement. Maintain the same interval or time between taking each dose and first decrease the dose.  Cut the total daily intake of opioids by one tablet each day Next start to increase the time between doses. The last dose that should be eliminated is the evening dose.      TED hose   Complete by: As directed    Use stockings (TED hose) for three weeks on both leg(s).  You may remove them at night for sleeping.   Weight bearing as tolerated   Complete by: As directed         Follow-up Information     Aluisio, Dempsey, MD Follow up in 2 week(s).   Specialty: Orthopedic Surgery Contact information: [address] [place] [place] [address] [phone]                  Signed: Roxie Mess 08/21/2024, 8:22 AM
